# Patient Record
Sex: Male | Born: 1980 | Race: Black or African American | Hispanic: No | Smoking: Current every day smoker
Health system: Southern US, Community
[De-identification: ages and names within clinical notes are randomized; demographics above are authoritative.]

## PROBLEM LIST (undated history)

## (undated) DIAGNOSIS — Z789 Other specified health status: Secondary | ICD-10-CM

## (undated) DIAGNOSIS — I10 Essential (primary) hypertension: Secondary | ICD-10-CM

## (undated) DIAGNOSIS — W3400XA Accidental discharge from unspecified firearms or gun, initial encounter: Secondary | ICD-10-CM

## (undated) HISTORY — DX: Essential (primary) hypertension: I10

---

## 2014-10-25 ENCOUNTER — Emergency Department (HOSPITAL_COMMUNITY)
Admission: EM | Admit: 2014-10-25 | Discharge: 2014-10-26 | Disposition: A | Discharge status: Home / Self Care | Payer: Self-pay | Attending: Emergency Medicine | Admitting: Emergency Medicine

## 2014-10-25 ENCOUNTER — Encounter (HOSPITAL_COMMUNITY): Payer: Self-pay | Admitting: Emergency Medicine

## 2014-10-25 DIAGNOSIS — G8929 Other chronic pain: Secondary | ICD-10-CM | POA: Insufficient documentation

## 2014-10-25 DIAGNOSIS — M25572 Pain in left ankle and joints of left foot: Secondary | ICD-10-CM | POA: Insufficient documentation

## 2014-10-25 DIAGNOSIS — Z720 Tobacco use: Secondary | ICD-10-CM | POA: Insufficient documentation

## 2014-10-25 DIAGNOSIS — M549 Dorsalgia, unspecified: Secondary | ICD-10-CM

## 2014-10-25 DIAGNOSIS — M545 Low back pain: Secondary | ICD-10-CM | POA: Insufficient documentation

## 2014-10-25 NOTE — ED Notes (Signed)
Pt c/o lower back pain x's 4-5 days.  No known injury.  Pt also st's he fell approx 1 1/2 weeks ago and c/o pain and swelling to left ankle

## 2014-10-26 MED ORDER — KETOROLAC TROMETHAMINE 60 MG/2ML IM SOLN
60.0000 mg | Freq: Once | INTRAMUSCULAR | Status: AC
  Administered 2014-10-26: 60 mg via INTRAMUSCULAR
  Filled 2014-10-26: qty 2

## 2014-10-26 NOTE — Discharge Instructions (Signed)
Return to the emergency room with worsening of symptoms, new symptoms or with symptoms that are concerning , especially fevers, loss of control of bladder or bowels, numbness or tingling around genital region or anus, weakness. °RICE: Rest, Ice (three cycles of 20 mins on, 20mins off at least twice a day), compression/brace, elevation. Heating pad works well for back pain. °Ibuprofen 400mg (2 tablets 200mg) every 5-6 hours for 3-5 days. °Follow up with PCP/orthopedist if symptoms worsen or are persistent. °Read below information and follow recommendations. ° °Back Injury Prevention °Back injuries can be extremely painful and difficult to heal. After having one back injury, you are much more likely to experience another later on. It is important to learn how to avoid injuring or re-injuring your back. The following tips can help you to prevent a back injury. °PHYSICAL FITNESS °· Exercise regularly and try to develop good tone in your abdominal muscles. Your abdominal muscles provide a lot of the support needed by your back. °· Do aerobic exercises (walking, jogging, biking, swimming) regularly. °· Do exercises that increase balance and strength (tai chi, yoga) regularly. This can decrease your risk of falling and injuring your back. °· Stretch before and after exercising. °· Maintain a healthy weight. The more you weigh, the more stress is placed on your back. For every pound of weight, 10 times that amount of pressure is placed on the back. °DIET °· Talk to your caregiver about how much calcium and vitamin D you need per day. These nutrients help to prevent weakening of the bones (osteoporosis). Osteoporosis can cause broken (fractured) bones that lead to back pain. °· Include good sources of calcium in your diet, such as dairy products, green, leafy vegetables, and products with calcium added (fortified). °· Include good sources of vitamin D in your diet, such as milk and foods that are fortified with vitamin  D. °· Consider taking a nutritional supplement or a multivitamin if needed. °· Stop smoking if you smoke. °POSTURE °· Sit and stand up straight. Avoid leaning forward when you sit or hunching over when you stand. °· Choose chairs with good low back (lumbar) support. °· If you work at a desk, sit close to your work so you do not need to lean over. Keep your chin tucked in. Keep your neck drawn back and elbows bent at a right angle. Your arms should look like the letter "L." °· Sit high and close to the steering wheel when you drive. Add a lumbar support to your car seat if needed. °· Avoid sitting or standing in one position for too long. Take breaks to get up, stretch, and walk around at least once every hour. Take breaks if you are driving for long periods of time. °· Sleep on your side with your knees slightly bent, or sleep on your back with a pillow under your knees. Do not sleep on your stomach. °LIFTING, TWISTING, AND REACHING °· Avoid heavy lifting, especially repetitive lifting. If you must do heavy lifting: °¨ Stretch before lifting. °¨ Work slowly. °¨ Rest between lifts. °¨ Use carts and dollies to move objects when possible. °¨ Make several small trips instead of carrying 1 heavy load. °¨ Ask for help when you need it. °¨ Ask for help when moving big, awkward objects. °· Follow these steps when lifting: °¨ Stand with your feet shoulder-width apart. °¨ Get as close to the object as you can. Do not try to pick up heavy objects that are far from your body. °¨   Use handles or lifting straps if they are available. °¨ Bend at your knees. Squat down, but keep your heels off the floor. °¨ Keep your shoulders pulled back, your chin tucked in, and your back straight. °¨ Lift the object slowly, tightening the muscles in your legs, abdomen, and buttocks. Keep the object as close to the center of your body as possible. °¨ When you put a load down, use these same guidelines in reverse. °· Do not: °¨ Lift the object  above your waist. °¨ Twist at the waist while lifting or carrying a load. Move your feet if you need to turn, not your waist. °¨ Bend over without bending at your knees. °· Avoid reaching over your head, across a table, or for an object on a high surface. °OTHER TIPS °· Avoid wet floors and keep sidewalks clear of ice to prevent falls. °· Do not sleep on a mattress that is too soft or too hard. °· Keep items that are used frequently within easy reach. °· Put heavier objects on shelves at waist level and lighter objects on lower or higher shelves. °· Find ways to decrease your stress, such as exercise, massage, or relaxation techniques. Stress can build up in your muscles. Tense muscles are more vulnerable to injury. °· Seek treatment for depression or anxiety if needed. These conditions can increase your risk of developing back pain. °SEEK MEDICAL CARE IF: °· You injure your back. °· You have questions about diet, exercise, or other ways to prevent back injuries. °MAKE SURE YOU: °· Understand these instructions. °· Will watch your condition. °· Will get help right away if you are not doing well or get worse. °Document Released: 08/16/2004 Document Revised: 10/01/2011 Document Reviewed: 08/20/2011 °[employer]® Patient Information ©2015 [employer]. This information is not intended to replace advice given to you by your health care provider. Make sure you discuss any questions you have with your health care provider. ° ° °

## 2014-10-26 NOTE — ED Notes (Signed)
Pt stable, ambulatory, pain decreased to 5/10, wife at bedside

## 2014-10-26 NOTE — ED Provider Notes (Signed)
CSN: [id number]     Arrival date & time 10/25/14  2244 History   First MD Initiated Contact with Patient 10/26/14 0008     Chief Complaint  Patient presents with  . Back Pain     (Consider location/radiation/quality/duration/timing/severity/associated sxs/prior Treatment) HPI  Bill [name] is a 34 y.o. male with PMH of herniated disc on right presenting with complaint of right-sided low back pain. Past 3-4 days. 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 Ports history of herniated disc diagnosed 3 years ago with MRI and it feels the same. No new neurological symptoms. No known injury. Movement makes his pain worse. He has not taken anything for his symptoms. No fevers, chills, night sweats, weight loss, IVDU, history of malignancy. No loss of control of bladder or bowel. No numbness/tingling, weakness or saddle anesthesia.  Patient also with complaint of fall 1-1/2 weeks ago with pain to left ankle worse with movement. She has not taken anything for his symptoms.   History reviewed. No pertinent past medical history. History reviewed. No pertinent past surgical history. No family history on file. History  Substance Use Topics  . Smoking status: Current Every Day Smoker  . Smokeless tobacco: Not on file  . Alcohol Use: No    Review of Systems  Musculoskeletal: Positive for myalgias. Negative for joint swelling.  Skin: Negative for color change and wound.  Neurological: Negative for weakness and numbness.      Allergies  Review of patient's allergies indicates no known allergies.  Home Medications   Prior to Admission medications   Medication Sig Start Date End Date Taking? Authorizing Provider  Aspirin-Acetaminophen-Caffeine (MIGRAINE RELIEF PO) Take 1 tablet by mouth daily as needed (migraine).   Yes Historical Provider, MD  ibuprofen (ADVIL,MOTRIN) 200 MG tablet Take 200 mg by mouth every 6 (six) hours as needed for fever or mild pain.   Yes  Historical Provider, MD   BP 124/69 mmHg  Pulse 74  Temp(Src) 97.7 F (36.5 C)  Resp 18  Ht 5\' 11"  (1.803 m)  Wt 220 lb (99.791 kg)  BMI 30.70 kg/m2  SpO2 95% Physical Exam  Constitutional: He appears well-developed and well-nourished. No distress.  HENT:  Head: Normocephalic and atraumatic.  Eyes: Conjunctivae are normal. Right eye exhibits no discharge. Left eye exhibits no discharge.  Cardiovascular: Normal rate, regular rhythm and normal heart sounds.   Pulmonary/Chest: Effort normal and breath sounds normal. No respiratory distress. He has no wheezes.  Abdominal: Soft. Bowel sounds are normal. He exhibits no distension. There is no tenderness.  Musculoskeletal:  Left ankle: Tenderness to diffuse dorsal foot. No ecchymoses, warmth, redness, edema. Full range of motion without tenderness. No tenderness to right proximal fibular head. No tenderness over base of fifth metatarsal or navicular. No tenderness of lateral or medial malleolus.  No midline back tenderness, step off or crepitus. Right sided lower back tenderness.    Neurological: He is alert. Coordination normal.  Equal muscle tone. 5/5 strength in lower extremities. DTR equal and intact. Positive right straight leg test. Negative left. Normal gait.   Skin: Skin is warm and dry. He is not diaphoretic.  Psychiatric: He has a normal mood and affect. His behavior is normal.  Nursing note and vitals reviewed.   ED Course  Procedures (including critical care time) Labs Review Labs Reviewed - No data to display  Imaging Review No results found.   EKG Interpretation None      MDM   Final diagnoses:  Left ankle pain  Chronic back pain   Patient with complaint of acute on chronic back pain with normal neurological exam except for positive straight leg raise. Patient with element of sciatica. I doubt cauda equina. Pain managed in the ED. Discussed rice protocol and NSAID use. Patient also with complaint of remote  fall with left ankle tenderness no indication for x-ray due to [place] ankle rules. Conservative treatment discussed and patient given Ace bandage and the ED. Patient to follow-up with orthopedist/primary for persistent symptoms.   Discussed return precautions with patient. Discussed all results and patient verbalizes understanding and agrees with plan.   Oswaldo Conroy, PA-C 10/26/14 0106  Gilda Crease, MD 10/26/14 [phone]

## 2015-01-15 ENCOUNTER — Emergency Department (HOSPITAL_COMMUNITY)
Admission: EM | Admit: 2015-01-15 | Discharge: 2015-01-15 | Disposition: A | Discharge status: Home / Self Care | Payer: Self-pay | Attending: Emergency Medicine | Admitting: Emergency Medicine

## 2015-01-15 ENCOUNTER — Emergency Department (HOSPITAL_COMMUNITY): Payer: Self-pay

## 2015-01-15 ENCOUNTER — Encounter (HOSPITAL_COMMUNITY): Payer: Self-pay | Admitting: *Deleted

## 2015-01-15 DIAGNOSIS — Z7982 Long term (current) use of aspirin: Secondary | ICD-10-CM | POA: Insufficient documentation

## 2015-01-15 DIAGNOSIS — R0789 Other chest pain: Secondary | ICD-10-CM | POA: Insufficient documentation

## 2015-01-15 DIAGNOSIS — Z720 Tobacco use: Secondary | ICD-10-CM | POA: Insufficient documentation

## 2015-01-15 DIAGNOSIS — G43809 Other migraine, not intractable, without status migrainosus: Secondary | ICD-10-CM | POA: Insufficient documentation

## 2015-01-15 LAB — BASIC METABOLIC PANEL
Anion gap: 10 (ref 5–15)
BUN: 10 mg/dL (ref 6–20)
CO2: 27 mmol/L (ref 22–32)
CREATININE: 1.22 mg/dL (ref 0.61–1.24)
Calcium: 9.4 mg/dL (ref 8.9–10.3)
Chloride: 102 mmol/L (ref 101–111)
GFR calc non Af Amer: >60 mL/min (ref >60)
GLUCOSE: 135 mg/dL — AB (ref 65–99)
POTASSIUM: 3.9 mmol/L (ref 3.5–5.1)
Sodium: 139 mmol/L (ref 135–145)

## 2015-01-15 LAB — CBC
HEMATOCRIT: 44.8 % (ref 39.0–52.0)
Hemoglobin: 15.6 g/dL (ref 13.0–17.0)
MCH: 29.9 pg (ref 26.0–34.0)
MCHC: 34.8 g/dL (ref 30.0–36.0)
MCV: 86 fL (ref 78.0–100.0)
PLATELETS: 332 10*3/uL (ref 150–400)
RBC: 5.21 MIL/uL (ref 4.22–5.81)
RDW: 13 % (ref 11.5–15.5)
WBC: 11.7 10*3/uL — ABNORMAL HIGH (ref 4.0–10.5)

## 2015-01-15 LAB — I-STAT TROPONIN, ED: Troponin i, poc: 0 ng/mL (ref 0.00–0.08)

## 2015-01-15 MED ORDER — KETOROLAC TROMETHAMINE 30 MG/ML IJ SOLN
30.0000 mg | Freq: Once | INTRAMUSCULAR | Status: AC
  Administered 2015-01-15: 30 mg via INTRAVENOUS
  Filled 2015-01-15: qty 1

## 2015-01-15 MED ORDER — CYCLOBENZAPRINE HCL 10 MG PO TABS: 10.0000 mg | ORAL_TABLET | Freq: Two times a day (BID) | ORAL | Status: DC | PRN

## 2015-01-15 MED ORDER — DIPHENHYDRAMINE HCL 50 MG/ML IJ SOLN
25.0000 mg | Freq: Once | INTRAMUSCULAR | Status: AC
  Administered 2015-01-15: 25 mg via INTRAVENOUS
  Filled 2015-01-15: qty 1

## 2015-01-15 MED ORDER — METOCLOPRAMIDE HCL 5 MG/ML IJ SOLN
10.0000 mg | Freq: Once | INTRAMUSCULAR | Status: AC
  Administered 2015-01-15: 10 mg via INTRAVENOUS
  Filled 2015-01-15: qty 2

## 2015-01-15 MED ORDER — IBUPROFEN 800 MG PO TABS: 800.0000 mg | ORAL_TABLET | Freq: Three times a day (TID) | ORAL | Status: DC

## 2015-01-15 MED ORDER — SODIUM CHLORIDE 0.9 % IV BOLUS (SEPSIS)
1000.0000 mL | Freq: Once | INTRAVENOUS | Status: AC
  Administered 2015-01-15: 1000 mL via INTRAVENOUS

## 2015-01-15 NOTE — ED Notes (Signed)
Pt c/o CP to mid chest with no radiation and no other symptoms. Denies hx of the same.

## 2015-01-15 NOTE — ED Provider Notes (Signed)
CSN: [id number]     Arrival date & time 01/15/15  0155 History   First MD Initiated Contact with Patient 01/15/15 0449     Chief Complaint  Patient presents with  . Chest Pain     (Consider location/radiation/quality/duration/timing/severity/associated sxs/prior Treatment) HPI Comments: Patient is a 34 year old male with no past medical history who presents with chest pain that started 2 days ago. Symptoms started gradually and remained constant since the onset. The pain is sharp and located in his central chest without radiation. Palpation and movement makes the pain worse. No alleviating factors. Patient reports doing heavy lifting at work. He did not try anything for pain relief. He reports associated headache which feels like his typical migraine. No other associated symptoms.    History reviewed. No pertinent past medical history. History reviewed. No pertinent past surgical history. No family history on file. History  Substance Use Topics  . Smoking status: Current Every Day Smoker -- 0.50 packs/day    Types: Cigarettes  . Smokeless tobacco: Not on file  . Alcohol Use: No    Review of Systems  Cardiovascular: Positive for chest pain.  Neurological: Positive for headaches.  All other systems reviewed and are negative.     Allergies  Review of patient's allergies indicates no known allergies.  Home Medications   Prior to Admission medications   Medication Sig Start Date End Date Taking? Authorizing Provider  Aspirin-Acetaminophen-Caffeine (MIGRAINE RELIEF PO) Take 1 tablet by mouth daily as needed (migraine).    Historical Provider, MD  ibuprofen (ADVIL,MOTRIN) 200 MG tablet Take 200 mg by mouth every 6 (six) hours as needed for fever or mild pain.    Historical Provider, MD   BP 131/79 mmHg  Pulse 77  Temp(Src) 97.9 F (36.6 C) (Oral)  Resp 18  Ht  (1.803 m)  Wt 225 lb (102.059 kg)  BMI 31.39 kg/m2  SpO2 97% Physical Exam  Constitutional: He is oriented  to person, place, and time. He appears well-developed and well-nourished. No distress.  HENT:  Head: Normocephalic and atraumatic.  Eyes: Conjunctivae and EOM are normal.  Neck: Normal range of motion.  Cardiovascular: Normal rate and regular rhythm.  Exam reveals no gallop and no friction rub.   No murmur heard. No lower extremity edema or calf tenderness to palpation.   Pulmonary/Chest: Effort normal and breath sounds normal. He has no wheezes. He has no rales. He exhibits tenderness.  Central chest tenderness to palpation. No deformity.   Abdominal: Soft. He exhibits no distension. There is no tenderness. There is no rebound.  Musculoskeletal: Normal range of motion.  Neurological: He is alert and oriented to person, place, and time. Coordination normal.  Speech is goal-oriented. Moves limbs without ataxia.   Skin: Skin is warm and dry.  Psychiatric: He has a normal mood and affect. His behavior is normal.  Nursing note and vitals reviewed.   ED Course  Procedures (including critical care time) Labs Review Labs Reviewed  BASIC METABOLIC PANEL - Abnormal; Notable for the following:    Glucose, Bld 135 (*)    All other components within normal limits  CBC - Abnormal; Notable for the following:    WBC 11.7 (*)    All other components within normal limits  I-STAT TROPOININ, ED    Imaging Review Dg Chest 2 View  01/15/2015   CLINICAL DATA:  Chest pain  EXAM: CHEST  2 VIEW  COMPARISON:  None.  FINDINGS: Lungs are clear.  No  pleural effusion or pneumothorax.  The heart is normal in size.  Visualized osseous structures are within normal limits.  IMPRESSION: No evidence of acute cardiopulmonary disease.   Electronically Signed   By: Charline Bills M.D.   On: 01/15/2015 02:30     EKG Interpretation None      MDM   Final diagnoses:  Chest wall pain  Other migraine without status migrainosus, not intractable    5:23 AM Patient's labs and chest xray show no acute changes.  Patient chest pain is reproducible with palpation of central chest. Patient is PERC negative and low risk for major cardiac event. Pain has been constant for 2 days so negative troponin sufficient for rule out. Vitals stable and patient afebrile. Patient will have migraine cocktail here and be discharged with ibuprofen and flexeril.    [address] [place], PA-C 01/15/15 0277  Azalia Bilis, MD 01/15/15 4128  Azalia Bilis, MD 01/15/15 [phone]

## 2015-01-15 NOTE — Discharge Instructions (Signed)
Take ibuprofen as needed for pain. Take flexeril as needed for muscle spasm. You may take these together. Refer to attached documents for more information.

## 2015-05-28 ENCOUNTER — Encounter (HOSPITAL_COMMUNITY): Payer: Self-pay | Admitting: *Deleted

## 2015-05-28 ENCOUNTER — Emergency Department (HOSPITAL_COMMUNITY)
Admission: EM | Admit: 2015-05-28 | Discharge: 2015-05-28 | Disposition: A | Discharge status: Home / Self Care | Payer: Self-pay | Attending: Emergency Medicine | Admitting: Emergency Medicine

## 2015-05-28 DIAGNOSIS — Z720 Tobacco use: Secondary | ICD-10-CM | POA: Insufficient documentation

## 2015-05-28 DIAGNOSIS — Z791 Long term (current) use of non-steroidal anti-inflammatories (NSAID): Secondary | ICD-10-CM | POA: Insufficient documentation

## 2015-05-28 DIAGNOSIS — B353 Tinea pedis: Secondary | ICD-10-CM

## 2015-05-28 MED ORDER — IBUPROFEN 600 MG PO TABS: 600.0000 mg | ORAL_TABLET | Freq: Four times a day (QID) | ORAL | Status: DC | PRN

## 2015-05-28 MED ORDER — TRIAMCINOLONE ACETONIDE 0.1 % EX CREA: 1.0000 "application " | TOPICAL_CREAM | Freq: Two times a day (BID) | CUTANEOUS | Status: DC

## 2015-05-28 MED ORDER — TRAMADOL HCL 50 MG PO TABS
50.0000 mg | ORAL_TABLET | Freq: Once | ORAL | Status: AC
  Administered 2015-05-28: 50 mg via ORAL
  Filled 2015-05-28: qty 1

## 2015-05-28 NOTE — ED Provider Notes (Signed)
CSN: [id number]     Arrival date & time 05/28/15  1810 History  By signing my name below, I, Bill [name], attest that this documentation has been prepared under the direction and in the presence of Bill [name]. Electronically Signed: Elon [name] ED Scribe. 05/28/2015. 7:07 PM.  Chief Complaint  Patient presents with  . Foot Pain   The history is provided by the patient. No language interpreter was used.   HPI Comments: Bill [name] is a 34 y.o. male who presents to the Emergency Department complaining of constant, moderate, worsening left ball-of-foot pain that began yesterday. He adds that pain is worse with pressure and that he walks frequently. Patient denies using unknown bathing facilities or having frequently wet/damp feet. Patient denies pain in any other areas, prior injury, or prior episodes of symptoms prior to onset. NKDA.  He denies leg swelling. Pt is unkempt and him and his wife appear to not shower on a regular basis.  History reviewed. No pertinent past medical history. History reviewed. No pertinent past surgical history. History reviewed. No pertinent family history. Social History  Substance Use Topics  . Smoking status: Current Every Day Smoker -- 0.50 packs/day    Types: Cigarettes  . Smokeless tobacco: None  . Alcohol Use: No    Review of Systems A complete 10 system review of systems was obtained and all systems are negative except as noted in the HPI and PMH.   Allergies  Fish allergy and Other  Home Medications   Prior to Admission medications   Medication Sig Start Date End Date Taking? Authorizing Provider  cyclobenzaprine (FLEXERIL) 10 MG tablet Take 1 tablet (10 mg total) by mouth 2 (two) times daily as needed for muscle spasms. 01/15/15   Kaitlyn Szekalski, PA-C  ibuprofen (ADVIL,MOTRIN) 600 MG tablet Take 1 tablet (600 mg total) by mouth every 6 (six) hours as needed for moderate pain. 05/28/15   Lilac Hoff Neva Seat, PA-C  ibuprofen (ADVIL,MOTRIN) 800  MG tablet Take 1 tablet (800 mg total) by mouth 3 (three) times daily. 01/15/15   Emilia Beck, PA-C  triamcinolone cream (KENALOG) 0.1 % Apply 1 application topically 2 (two) times daily. $4 list 80 gm tube of cream 05/28/15   Hajime Asfaw, PA-C   BP 123/62 mmHg  Pulse 77  Temp(Src) 98.4 F (36.9 C)  Resp 16  Ht  (1.803 m)  Wt 225 lb (102.059 kg)  BMI 31.39 kg/m2  SpO2 99% Physical Exam  Constitutional: He is oriented to person, place, and time. He appears well-developed and well-nourished. No distress.  HENT:  Head: Normocephalic and atraumatic.  Eyes: Conjunctivae and EOM are normal.  Neck: Neck supple. No tracheal deviation present.  Cardiovascular: Normal rate.   Pulmonary/Chest: Effort normal. No respiratory distress.  Musculoskeletal: Normal range of motion.  Left foot exhibits erythematous erosions and scales between the toes. NO associated fissures or ulcers. No drainage or fluctuance, no vesicles or bullous. Rash spares rest of body  Neurological: He is alert and oriented to person, place, and time.  Skin: Skin is warm and dry.  Psychiatric: He has a normal mood and affect. His behavior is normal.  Nursing note and vitals reviewed.   ED Course  Procedures  DIAGNOSTIC STUDIES: Oxygen Saturation is 99% on RA, normal by my interpretation.    COORDINATION OF CARE: 6:56 PM Will prescribe antifungal cream and ibuprofen for pain. Advised to keep area dry and clean. Patient offered but declined crutches. Patient acknowledges and agrees with plan.  Referred to derm, advised to come back for alternative treatment if rash fails to get worse or worsens at any time.  Labs Review Labs Reviewed - No data to display  Imaging Review No results found. I have personally reviewed and evaluated these images and lab results as part of my medical decision-making.   EKG Interpretation None      MDM   Final diagnoses:  Athlete's foot on left    Medications  traMADol  (ULTRAM) tablet 50 mg (50 mg Oral Given 05/28/15 1914)    34 y.o.Mete Dudenhoeffer's medical screening exam was performed and I feel the patient has had an appropriate workup for their chief complaint at this time and likelihood of emergent condition existing is low. They have been counseled on decision, discharge, follow up and which symptoms necessitate immediate return to the emergency department. They or their family verbally stated understanding and agreement with plan and discharged in stable condition.   Vital signs are stable at discharge. Filed Vitals:   05/28/15 1826  BP: 123/62  Pulse: 77  Temp: 98.4 F (36.9 C)  Resp: 16    I personally performed the services described in this documentation, which was scribed in my presence. The recorded information has been reviewed and is accurate.    Bill Peliffany Jaxxson [place], PA-C 05/28/15 1929  Cathren LaineKevin Steinl, MD 05/28/15 [phone]

## 2015-05-28 NOTE — ED Notes (Signed)
PT reports pain to LT foot started on Friday night. Pt has a red area of skin located on ball of Lt foot extending up between toes.

## 2015-05-28 NOTE — Discharge Instructions (Signed)
Athlete's Foot  Athlete's foot is a skin infection caused by a fungus. Athlete's foot is often seen between or under the toes. It can also be seen on the bottom of the foot. Athlete's foot can spread to other people by sharing towels or shower stalls. HOME CARE  Only take medicines as told by your doctor. Do not use steroid creams.  Wash your feet daily. Dry your feet well, especially between the toes.  Change your socks every day. Wear cotton or wool socks.  Change your socks 2 to 3 times a day in hot weather.  Wear sandals or canvas tennis shoes with good airflow.  If you have blisters, soak your feet in a solution as told by your doctor. Do this for 20 to 30 minutes, 2 times a day. Dry your feet well after you soak them.  Do not share towels.  Wear sandals when you use shared locker rooms or showers. GET HELP RIGHT AWAY IF:   You have a fever.  Your foot is puffy (swollen), sore, warm, or red.  You are not getting better after 7 days of treatment.  You still have athlete's foot after 30 days.  You have problems caused by your medicine. MAKE SURE YOU:   Understand these instructions.  Will watch your condition.  Will get help right away if you are not doing well or get worse.   This information is not intended to replace advice given to you by your health care provider. Make sure you discuss any questions you have with your health care provider.   Document Released: 12/26/2007 Document Revised: 10/01/2011 Document Reviewed: 01/10/2015 Elsevier Interactive Patient Education 2016 [place] Released: 07/06/2000 Document Revised: 10/01/2011 Document Reviewed: 01/10/2015 Elsevier Interactive Patient Education 2016 [place] GUIDE  Chronic Pain Problems: Contact [facility]  [phone] Patients need to be referred by their primary care doctor.  Insufficient Money for Medicine: Contact United Way:  call "211" or  [place] [phone].  No Primary Care Doctor: Call Health Connect  [phone] - can help you locate a primary care doctor that  accepts your insurance, provides certain services, etc. Physician Referral Service- [phone]  Agencies that provide inexpensive medical care: [facility]  [facility]  [phone] [facility]  [phone] [facility]  [phone] Planned Parenthood  [phone] [facility]  [phone]  [place] Providers: [facility]- [address], [address]  [phone], Mon-Fri 9am-7pm, Sat 9am-1pm [place], [address] [place]  [place], [address] [place]  [place]- [address]  [place], [address], [phone]  Only accepts [place] patients after they have their name  applied to their card  Self Pay (no insurance) in [facility]: Sickle Cell Patients: Dr Kevan Ny, [facility]  [place], [facility]- [place]  [place] [place]- [address], [facility]- see information above (Speak to [employer] if you do not have insurance)       -  [facility]- [place],  [place],  [place]       -  [facility]- [address], [place]  Dr Vista Lawman-  [address], [place], [place] Urgent Care- [address], [phone]       -  [facility] [place]- [address], [place], also [address], [phone]       -    [facility]- [address], [place], 1st & 3rd Saturday   every month, 10am-1pm  1) Find a  Doctor and Pay Out of Pocket Although you won't have to find out who is covered by your insurance plan, it is a good idea to ask around and get recommendations. You will then need to call the office and see if the doctor you have chosen will accept you as a new patient and what types of options they offer for patients who are self-pay. Some doctors offer discounts or will set up payment plans for their patients who do not have insurance, but you will need to ask so you aren't surprised when you get to your appointment.  2) Contact Your Local Health Department Not all health departments have doctors that can see patients for sick visits, but many do, so it is worth a call to see if yours does. If you don't know where your local health department is, you can check in your phone book. The CDC also has a tool to help you locate your state's health department, and many state websites also have listings of all of their local health departments.  3) Find a [facility] If your illness is not likely to be very severe or complicated, you may want to try a walk in clinic. These are popping up all over the country in pharmacies, drugstores, and shopping centers. They're usually staffed by nurse practitioners or physician assistants that have been trained to treat common illnesses and complaints. They're usually fairly quick and inexpensive. However, if you have serious medical issues or chronic medical problems, these are probably not your best option  [facility] [place], [facility], [address] [place], phone [phone] or [phone].  Monday - Friday, call for an appointment. [place], [facility], [address], [place], phone [phone] or [phone].  Monday - Friday, call for an appointment.  Abuse/Neglect: [place] [phone] [place]  [phone] (After Hours)  Emergency Shelter:  [facility] [phone]  Maternity Homes: [facility] [place] [phone] [place] [phone]  MRSA Hotline #:   (901)503-6641  [facility]. [address] [place]  [facility] Phone:  [phone]                                  Phone:  [phone]                   Phone:  [phone]  [facility], [place]- [phone]       -     [facility] in [place], [address]                                  [phone], [place] [phone] or [phone] (After Hours)   [place]  Substance Abuse Resources: Alcohol and Drug Services  7177520727 [place] [phone] The [place] [phone] Chinita Pester [phone] Residential & Outpatient Substance Abuse Program  9057489548  Psychological Services: [place]  [phone] [place]  [place], [place] [address]. [address], [place], [place]: [phone] or [phone], [email or web address]  Dental Assistance  If unable to pay or uninsured, contact:  [facility] or [facility]. to become qualified for the adult dental clinic.  Patients with Medicaid: [facility] [phone] [address], [place] [address] [phone]  If unable to pay, or uninsured, contact [facility] [phone]) or [place] [phone] in [place], [place] in [facility]) to become  qualified for the adult dental clinic  Other [place]- [place], [place], [place], [address], [place], [place], 2nd and 4th Thursday of the month at 6:30am.  10 clients each day by appointment, can sometimes see walk-in patients if someone does not show for an appointment. [facility]- [address] Hillard Danker [place], [place], [address], [place], [place], [place], [address], [place] [facility] [phone]  Please make every effort to establish with a primary care physician for routine medical care  [place]  The [place] provides a wide range of adult health services. Some of these services are designed to address the healthcare needs of all [facility] residents and all services are designed to meet the needs of uninsured/underinsured low income residents. Some services are available to any resident of [place], call [phone] for details. ] The [facility], a new medical clinic for adults, is now open. For more information about the Center and its services please call [phone]. For information on our [place] services, click here.  For more information on any of the following Department of Public Health programs, including hours of service, click on the highlighted link.  SERVICES FOR WOMEN (Adults and Teens) [employer] provide a full range of birth control options plus education and counseling. New patient visit and annual return visits include a complete examination, pap test as indicated, and other laboratory as indicated. Included is our [employer] for men.  Maternity Care is provided through pregnancy, including a six week post partum exam. Women who meet  eligibility criteria for the Medicaid for Pregnant Women program, receive care free. Other women are charged on a sliding scale according to income. Note: Our  Dental Clinic provides services to pregnant women who have a Medicaid card. Call [phone] for an appointment in [place] or [phone] for an appointment in [facility].  Primary Care for Medicaid [place] Access Women is available through the [place]. As primary care provider for the [place] program, women may designate the [facility] clinic as their primary care provider.  PLEASE CALL [phone] FOR AN APPOINTMENT FOR THE ABOVE SERVICES IN EITHER [place] OR [place]. Information available in [place] and [place].   Childbirth Education Classes are open to the public and offered to help families prepare for the best possible childbirth experience as well as to promote lifelong health and wellness. Classes are offered throughout the year and meet on the same night once a week for five weeks. Medicaid covers the cost of the classes for the mother-to-be and her partner. For participants without Medicaid, the cost of the class series is $45.00 for the mother-to-be and her partner. Class size is limited and registration is required. For more information or to register call [phone]. Baby items donated by [employer] and the [employer] [place] are given away during each class series.  SERVICES FOR WOMEN AND MEN Sexually Transmitted Infection appointments, including HIV testing, are available daily (weekdays, except holidays). Call early as same-day appointments are limited. For an appointment in either [facility] or [place], call [phone]. Services are confidential and free of charge.  Skin Testing for Tuberculosis Please call [phone]. Adult Immunizations are available, usually for a fee. Please call [phone] for details.  PLEASE CALL  [phone] FOR AN APPOINTMENT FOR THE ABOVE SERVICES IN EITHER [place] OR [place].   [facility] provides up to the minute recommended vaccines for your travel destination. We also provide essential health and political information to help insure a safe and pleasurable travel experience. This program is self-sustaining, however, fees are very competitive. We are a CERTIFIED YELLOW FEVER IMMUNIZATION approved clinic site. PLEASE CALL [phone] FOR AN APPOINTMENT IN EITHER [place] OR [place].   If you have questions about the services listed above, we want to answer them! Email [place] at: [email or web address] Visiting Services for elderly and the disabled are available to residents of [facility] who are in need of care that compares to the care offered by a nursing home, have needs that can be met by the program, and have [place] Medicaid. Other short term services are available to residents 18 years and older who are unable to meet requirements for eligibility to receive services from a certified home health agency, spend the majority of time at home, and need care for six months or less.  PLEASE CALL [phone] OR [phone] FOR MORE INFORMATION. Medication Assistance Program serves as a link between pharmaceutical companies and patients to provide low cost or free prescription medications. This servce is available for residents who meet certain income restrictions and have no insurance coverage.  PLEASE CALL [phone] ([place]) OR [phone] ([place]) FOR MORE INFORMATION.  Updated Feb. 21, 2013

## 2015-06-30 ENCOUNTER — Emergency Department (HOSPITAL_COMMUNITY)
Admission: EM | Admit: 2015-06-30 | Discharge: 2015-06-30 | Disposition: A | Discharge status: Home / Self Care | Payer: Self-pay | Attending: Emergency Medicine | Admitting: Emergency Medicine

## 2015-06-30 ENCOUNTER — Emergency Department (HOSPITAL_COMMUNITY): Payer: Self-pay

## 2015-06-30 ENCOUNTER — Encounter (HOSPITAL_COMMUNITY): Payer: Self-pay | Admitting: Emergency Medicine

## 2015-06-30 DIAGNOSIS — Y9367 Activity, basketball: Secondary | ICD-10-CM | POA: Insufficient documentation

## 2015-06-30 DIAGNOSIS — S63259A Unspecified dislocation of unspecified finger, initial encounter: Secondary | ICD-10-CM

## 2015-06-30 DIAGNOSIS — Z791 Long term (current) use of non-steroidal anti-inflammatories (NSAID): Secondary | ICD-10-CM | POA: Insufficient documentation

## 2015-06-30 DIAGNOSIS — X58XXXA Exposure to other specified factors, initial encounter: Secondary | ICD-10-CM | POA: Insufficient documentation

## 2015-06-30 DIAGNOSIS — Y998 Other external cause status: Secondary | ICD-10-CM | POA: Insufficient documentation

## 2015-06-30 DIAGNOSIS — F17210 Nicotine dependence, cigarettes, uncomplicated: Secondary | ICD-10-CM | POA: Insufficient documentation

## 2015-06-30 DIAGNOSIS — S63256A Unspecified dislocation of right little finger, initial encounter: Secondary | ICD-10-CM | POA: Insufficient documentation

## 2015-06-30 DIAGNOSIS — Z7952 Long term (current) use of systemic steroids: Secondary | ICD-10-CM | POA: Insufficient documentation

## 2015-06-30 DIAGNOSIS — Y92310 Basketball court as the place of occurrence of the external cause: Secondary | ICD-10-CM | POA: Insufficient documentation

## 2015-06-30 MED ORDER — OXYCODONE-ACETAMINOPHEN 5-325 MG PO TABS
2.0000 | ORAL_TABLET | Freq: Once | ORAL | Status: AC
  Administered 2015-06-30: 2 via ORAL
  Filled 2015-06-30: qty 2

## 2015-06-30 MED ORDER — LIDOCAINE HCL (PF) 1 % IJ SOLN
5.0000 mL | Freq: Once | INTRAMUSCULAR | Status: AC
  Administered 2015-06-30: 5 mL via INTRADERMAL
  Filled 2015-06-30: qty 5

## 2015-06-30 NOTE — ED Notes (Signed)
Pt from home with c/o right 5th digit injury with deformity and swelling noted s/p playing basketball yesterday.  NAD, A&O.

## 2015-06-30 NOTE — Discharge Instructions (Signed)
Finger Dislocation Take ibuprofen or Tylenol for pain.  A finger dislocation happens when your finger bones separate from where they connect with each other. It usually happens to the joint closest to your knuckle (proximal interphalangeal joint). Your doctor will put your bones back in the joint. This may be done by pulling on your finger or through surgery. A bandage (dressing) or splint will be placed around your joint. The bandage or splint holds your finger in place while it heals. HOME CARE   Rest your injured joint. Do not move it until told to do so.  Put ice on your injured joint as told by your doctor.  Put ice in a plastic bag.  Place a towel between your skin and the bag.  Leave the ice on for 15-20 minutes at a time. Do this every 2 hours while you are awake.  Raise (elevate) your hand above your heart as told by your doctor.  Only take medicines as told by your doctor. GET HELP RIGHT AWAY IF:  Your bandage or splint becomes damaged.  Your pain becomes worse, not better.  You lose feeling in your finger or it becomes cold or white. MAKE SURE YOU:  Understand these instructions.  Will watch your condition.  Will get help right away if you are not doing well or get worse.   This information is not intended to replace advice given to you by your health care provider. Make sure you discuss any questions you have with your health care provider.   Document Released: 06/28/2011 Document Revised: 07/30/2014 Document Reviewed: 12/03/2014 Elsevier Interactive Patient Education [employer]2016 Elsevier Inc.

## 2015-06-30 NOTE — ED Provider Notes (Signed)
History  By signing my name below, I, Karle Plumber, attest that this documentation has been prepared under the direction and in the presence of [employer], PA-C. Electronically Signed: Karle Plumber, ED Scribe. 06/30/2015. 2:58 PM.  Chief Complaint  Patient presents with  . Finger Injury   The history is provided by the patient and medical records. No language interpreter was used.    HPI Comments:  Bill [name] is a 34 y.o. male who presents to the Emergency Department complaining of severe pain of the right fifth digit from an injury he sustained while playing basketball yesterday. He reports associated swelling of the finger. He has not taken anything for pain. Touching or trying to move the right 5th digit increases the pain. He denies modifying factors. He denies numbness, tingling or weakness of the right 5th digit or right hand, bruising or wounds.   History reviewed. No pertinent past medical history. History reviewed. No pertinent past surgical history. History reviewed. No pertinent family history. Social History  Substance Use Topics  . Smoking status: Current Every Day Smoker -- 0.50 packs/day    Types: Cigarettes  . Smokeless tobacco: Never Used  . Alcohol Use: No    Review of Systems  Musculoskeletal: Positive for joint swelling and arthralgias.  Skin: Negative for color change and wound.  Neurological: Negative for weakness and numbness.    Allergies  Fish allergy and Other  Home Medications   Prior to Admission medications   Medication Sig Start Date End Date Taking? Authorizing Provider  cyclobenzaprine (FLEXERIL) 10 MG tablet Take 1 tablet (10 mg total) by mouth 2 (two) times daily as needed for muscle spasms. 01/15/15   Kaitlyn Szekalski, PA-C  ibuprofen (ADVIL,MOTRIN) 600 MG tablet Take 1 tablet (600 mg total) by mouth every 6 (six) hours as needed for moderate pain. 05/28/15   Tiffany Neva Seat, PA-C  ibuprofen (ADVIL,MOTRIN) 800 MG tablet Take  1 tablet (800 mg total) by mouth 3 (three) times daily. 01/15/15   Emilia Beck, PA-C  triamcinolone cream (KENALOG) 0.1 % Apply 1 application topically 2 (two) times daily. $4 list 80 gm tube of cream 05/28/15   Marlon Pel, PA-C   Triage Vitals: BP 117/84 mmHg  Pulse 65  Temp(Src) 98.2 F (36.8 C) (Oral)  Resp 18  SpO2 98% Physical Exam  Constitutional: He is oriented to person, place, and time. He appears well-developed and well-nourished.  HENT:  Head: Normocephalic and atraumatic.  Eyes: EOM are normal.  Neck: Normal range of motion.  Cardiovascular: Normal rate.   Cap refill less than 3 seconds. Right radial pulse 2+.  Pulmonary/Chest: Effort normal.  Musculoskeletal: Normal range of motion.  Able to flex and extend all fingers on right hand except 5th digit. Significant swelling to fifth digit of right hand.  Neurological: He is alert and oriented to person, place, and time.  Skin: Skin is warm and dry.  Psychiatric: He has a normal mood and affect. His behavior is normal.  Nursing note and vitals reviewed.   ED Course  Procedures (including critical care time) DIAGNOSTIC STUDIES: Oxygen Saturation is 98% on RA, normal by my interpretation.   COORDINATION OF CARE: 1:58 PM- Informed pt of right 5th digit dislocation. Will order pain medication and administer digital block prior to reducing finger. Pt verbalizes understanding and agrees to plan.  2:15- Administered digital block to right fifth digit.  2:59 PM- Reduced 5th digit. Applied splint.  Medications  oxyCODONE-acetaminophen (PERCOCET/ROXICET) 5-325 MG per tablet 2 tablet (2  tablets Oral Given 06/30/15 1403)  lidocaine (PF) (XYLOCAINE) 1 % injection 5 mL (5 mLs Intradermal Given by Other 06/30/15 1404)   NERVE BLOCK Performed by: Catha GosselinPatel-Mills, Mitzie Marlar Consent: Verbal consent obtained. Required items: required blood products, implants, devices, and special equipment available Time out: Immediately prior to  procedure a "time out" was called to verify the correct patient, procedure, equipment, support staff and site/side marked as required. Indication: comfort Nerve block body site: right fifth digit Preparation: Patient was prepped and draped in the usual sterile fashion. Needle gauge: 25 G Location technique: anatomical landmarks Local anesthetic: lidocaine 1% without epinepherine Anesthetic total: 5 ml Outcome: pain improved Patient tolerance: Patient tolerated the procedure well with no immediate complications. I did not think that re-x-raying the patient was necessary since I could visualize that the finger was back in place.  Labs Review Labs Reviewed - No data to display  Imaging Review Dg Hand Complete Right  06/30/2015  CLINICAL DATA:  Basketball injury yesterday EXAM: RIGHT HAND - COMPLETE 3+ VIEW COMPARISON:  none FINDINGS: .Dorsal dislocation of the fifth PIP joint. No definite fracture. Otherwise negative. IMPRESSION: Dislocated fifth PIP joint . Electronically Signed   By: Marlan Palauharles  Clark M.D.   On: 06/30/2015 13:37   I have personally reviewed and evaluated these images results as part of my medical decision-making.   EKG Interpretation None      MDM   Final diagnoses:  Finger dislocation, initial encounter   Patient X-Ray negative for obvious fracture but shows dislocation of fifth PIP joint. Finger was relocated. Pt advised to follow up with orthopedics. Splint applied to right fifth digit while in ED, conservative therapy recommended and discussed. Patient will be discharged home & is agreeable with above plan. Returns precautions discussed. Pt appears safe for discharge.  I personally performed the services described in this documentation, which was scribed in my presence. The recorded information has been reviewed and is accurate.     Catha GosselinHanna Patel-Mills, PA-C 06/30/15 1914  Pricilla LovelessScott Goldston, MD 07/03/15 [phone]

## 2015-07-14 DIAGNOSIS — Z5900 Homelessness unspecified: Secondary | ICD-10-CM

## 2015-07-14 NOTE — Congregational Nurse Program (Signed)
Congregational Nurse Program Note  Date of Encounter: 07/14/2015  Past Medical History: No past medical history on file.  Encounter Details:     CNP Questionnaire - 07/14/15 0855    Patient Demographics   Is this a new or existing patient? New   Patient is considered a/an Not Applicable   Patient Assistance   Patient's financial/insurance status Uninsured;Low Income   Patient referred to apply for the following financial assistance [employer] Card/Care Connects   Food insecurities addressed Provided food supplies   Transportation assistance No   Assistance securing medications Yes   Educational health offerings Acute disease;Navigating the healthcare system;Medications  smoking cessation   Encounter Details   Primary purpose of visit Acute Illness/Condition Visit;Education/Health Concerns;Navigating the Healthcare System   Was an Emergency Department visit averted? Yes   Does patient have a medical provider? No   Patient referred to Clinic   Was a mental health screening completed? (GAINS tool) No   Does patient have dental issues? No   Since previous encounter, have you referred patient for abnormal blood pressure that resulted in a new diagnosis or medication change? No   Since previous encounter, have you referred patient for abnormal blood glucose that resulted in a new diagnosis or medication change? No   For Abstraction Use Only   Does patient have insurance? No       Clinic visit.  Was seen in the ED on 06/30/15 for a dislocated finger, right hand.  Is not wearing splint, states "it fell off".  Complains of pain and swelling.  States, does not know what to do.  Also requested B/P check.  Referral made to Lavinia SharpsMary Ann Placey, NP at [facility] for assistance with orange card and f/u with dislocated finger.  [facility]  (Zoloft and Sereque).

## 2015-07-14 NOTE — Patient Instructions (Signed)
1.  [facility], see Lavinia SharpsMary Ann Placey NP for follow up with dislocated finger 2.  [facility] to apply for assistance with psychiatric medications 3.  Continue Ibuprofen as directed by ED physician 4.  Return [facility] nurse clinic if unable to see Chales AbrahamsMary Ann today to have splint re-applied.

## 2016-04-10 ENCOUNTER — Inpatient Hospital Stay (HOSPITAL_COMMUNITY)
Admission: EM | Admit: 2016-04-10 | Discharge: 2016-05-08 | DRG: 957 | Disposition: A | Discharge status: Rehabilitation Facility | Payer: Self-pay | Attending: General Surgery | Admitting: General Surgery

## 2016-04-10 ENCOUNTER — Emergency Department (HOSPITAL_COMMUNITY): Payer: Self-pay | Admitting: Certified Registered Nurse Anesthetist

## 2016-04-10 ENCOUNTER — Encounter (HOSPITAL_COMMUNITY): Payer: Self-pay

## 2016-04-10 ENCOUNTER — Emergency Department (HOSPITAL_COMMUNITY): Payer: Self-pay

## 2016-04-10 ENCOUNTER — Encounter (HOSPITAL_COMMUNITY): Admission: EM | Disposition: A | Discharge status: Rehabilitation Facility | Payer: Self-pay | Source: Home / Self Care

## 2016-04-10 DIAGNOSIS — J9600 Acute respiratory failure, unspecified whether with hypoxia or hypercapnia: Secondary | ICD-10-CM

## 2016-04-10 DIAGNOSIS — Z4659 Encounter for fitting and adjustment of other gastrointestinal appliance and device: Secondary | ICD-10-CM

## 2016-04-10 DIAGNOSIS — J939 Pneumothorax, unspecified: Secondary | ICD-10-CM

## 2016-04-10 DIAGNOSIS — I471 Supraventricular tachycardia: Secondary | ICD-10-CM | POA: Diagnosis present

## 2016-04-10 DIAGNOSIS — Z9289 Personal history of other medical treatment: Secondary | ICD-10-CM

## 2016-04-10 DIAGNOSIS — R4189 Other symptoms and signs involving cognitive functions and awareness: Secondary | ICD-10-CM

## 2016-04-10 DIAGNOSIS — S36893A Laceration of other intra-abdominal organs, initial encounter: Secondary | ICD-10-CM | POA: Diagnosis present

## 2016-04-10 DIAGNOSIS — D62 Acute posthemorrhagic anemia: Secondary | ICD-10-CM | POA: Diagnosis present

## 2016-04-10 DIAGNOSIS — Z419 Encounter for procedure for purposes other than remedying health state, unspecified: Secondary | ICD-10-CM

## 2016-04-10 DIAGNOSIS — G9349 Other encephalopathy: Secondary | ICD-10-CM | POA: Diagnosis not present

## 2016-04-10 DIAGNOSIS — S27321A Contusion of lung, unilateral, initial encounter: Secondary | ICD-10-CM | POA: Diagnosis present

## 2016-04-10 DIAGNOSIS — Z9109 Other allergy status, other than to drugs and biological substances: Secondary | ICD-10-CM

## 2016-04-10 DIAGNOSIS — S31109A Unspecified open wound of abdominal wall, unspecified quadrant without penetration into peritoneal cavity, initial encounter: Secondary | ICD-10-CM

## 2016-04-10 DIAGNOSIS — D696 Thrombocytopenia, unspecified: Secondary | ICD-10-CM | POA: Diagnosis present

## 2016-04-10 DIAGNOSIS — Z91013 Allergy to seafood: Secondary | ICD-10-CM

## 2016-04-10 DIAGNOSIS — Z79899 Other long term (current) drug therapy: Secondary | ICD-10-CM

## 2016-04-10 DIAGNOSIS — J9382 Other air leak: Secondary | ICD-10-CM | POA: Diagnosis present

## 2016-04-10 DIAGNOSIS — J90 Pleural effusion, not elsewhere classified: Secondary | ICD-10-CM

## 2016-04-10 DIAGNOSIS — Z4682 Encounter for fitting and adjustment of non-vascular catheter: Secondary | ICD-10-CM

## 2016-04-10 DIAGNOSIS — S270XXA Traumatic pneumothorax, initial encounter: Secondary | ICD-10-CM

## 2016-04-10 DIAGNOSIS — Z6833 Body mass index (BMI) 33.0-33.9, adult: Secondary | ICD-10-CM

## 2016-04-10 DIAGNOSIS — J189 Pneumonia, unspecified organism: Secondary | ICD-10-CM | POA: Diagnosis present

## 2016-04-10 DIAGNOSIS — I480 Paroxysmal atrial fibrillation: Secondary | ICD-10-CM

## 2016-04-10 DIAGNOSIS — E669 Obesity, unspecified: Secondary | ICD-10-CM | POA: Diagnosis present

## 2016-04-10 DIAGNOSIS — I312 Hemopericardium, not elsewhere classified: Secondary | ICD-10-CM | POA: Diagnosis present

## 2016-04-10 DIAGNOSIS — R578 Other shock: Secondary | ICD-10-CM | POA: Diagnosis present

## 2016-04-10 DIAGNOSIS — T81509D Unspecified complication of foreign body accidentally left in body following unspecified procedure, subsequent encounter: Secondary | ICD-10-CM

## 2016-04-10 DIAGNOSIS — E876 Hypokalemia: Secondary | ICD-10-CM | POA: Diagnosis not present

## 2016-04-10 DIAGNOSIS — Z9689 Presence of other specified functional implants: Secondary | ICD-10-CM

## 2016-04-10 DIAGNOSIS — I493 Ventricular premature depolarization: Secondary | ICD-10-CM | POA: Diagnosis not present

## 2016-04-10 DIAGNOSIS — R531 Weakness: Secondary | ICD-10-CM

## 2016-04-10 DIAGNOSIS — W3400XA Accidental discharge from unspecified firearms or gun, initial encounter: Secondary | ICD-10-CM

## 2016-04-10 DIAGNOSIS — G931 Anoxic brain damage, not elsewhere classified: Secondary | ICD-10-CM | POA: Diagnosis present

## 2016-04-10 DIAGNOSIS — J9690 Respiratory failure, unspecified, unspecified whether with hypoxia or hypercapnia: Secondary | ICD-10-CM

## 2016-04-10 DIAGNOSIS — Z9911 Dependence on respirator [ventilator] status: Secondary | ICD-10-CM

## 2016-04-10 DIAGNOSIS — N179 Acute kidney failure, unspecified: Secondary | ICD-10-CM | POA: Diagnosis present

## 2016-04-10 DIAGNOSIS — S272XXA Traumatic hemopneumothorax, initial encounter: Secondary | ICD-10-CM

## 2016-04-10 DIAGNOSIS — Z9889 Other specified postprocedural states: Secondary | ICD-10-CM

## 2016-04-10 DIAGNOSIS — T17908A Unspecified foreign body in respiratory tract, part unspecified causing other injury, initial encounter: Secondary | ICD-10-CM

## 2016-04-10 DIAGNOSIS — G936 Cerebral edema: Secondary | ICD-10-CM | POA: Diagnosis present

## 2016-04-10 DIAGNOSIS — S31811A Laceration without foreign body of right buttock, initial encounter: Secondary | ICD-10-CM | POA: Diagnosis present

## 2016-04-10 DIAGNOSIS — J9811 Atelectasis: Secondary | ICD-10-CM | POA: Diagnosis present

## 2016-04-10 DIAGNOSIS — S21139A Puncture wound without foreign body of unspecified front wall of thorax without penetration into thoracic cavity, initial encounter: Secondary | ICD-10-CM

## 2016-04-10 DIAGNOSIS — G253 Myoclonus: Secondary | ICD-10-CM | POA: Diagnosis present

## 2016-04-10 DIAGNOSIS — R0682 Tachypnea, not elsewhere classified: Secondary | ICD-10-CM

## 2016-04-10 DIAGNOSIS — J9601 Acute respiratory failure with hypoxia: Secondary | ICD-10-CM | POA: Diagnosis present

## 2016-04-10 DIAGNOSIS — G9340 Encephalopathy, unspecified: Secondary | ICD-10-CM

## 2016-04-10 DIAGNOSIS — S299XXA Unspecified injury of thorax, initial encounter: Secondary | ICD-10-CM

## 2016-04-10 DIAGNOSIS — S36113A Laceration of liver, unspecified degree, initial encounter: Principal | ICD-10-CM | POA: Diagnosis present

## 2016-04-10 DIAGNOSIS — S271XXA Traumatic hemothorax, initial encounter: Secondary | ICD-10-CM | POA: Diagnosis present

## 2016-04-10 DIAGNOSIS — R29898 Other symptoms and signs involving the musculoskeletal system: Secondary | ICD-10-CM

## 2016-04-10 DIAGNOSIS — J942 Hemothorax: Secondary | ICD-10-CM

## 2016-04-10 HISTORY — PX: PERICARDIAL WINDOW: SHX2213

## 2016-04-10 HISTORY — PX: LIVER REPAIR: SHX6734

## 2016-04-10 HISTORY — DX: Other specified health status: Z78.9

## 2016-04-10 HISTORY — PX: LAPAROTOMY: SHX154

## 2016-04-10 LAB — I-STAT CHEM 8, ED
BUN: 12 mg/dL (ref 6–20)
CALCIUM ION: 1.08 mmol/L — AB (ref 1.15–1.40)
CHLORIDE: 106 mmol/L (ref 101–111)
Creatinine, Ser: 1.3 mg/dL — ABNORMAL HIGH (ref 0.61–1.24)
GLUCOSE: 213 mg/dL — AB (ref 65–99)
HCT: 44 % (ref 39.0–52.0)
HEMOGLOBIN: 15 g/dL (ref 13.0–17.0)
Potassium: 3.9 mmol/L (ref 3.5–5.1)
SODIUM: 143 mmol/L (ref 135–145)
TCO2: 17 mmol/L (ref 0–100)

## 2016-04-10 LAB — COMPREHENSIVE METABOLIC PANEL
ALBUMIN: 3.4 g/dL — AB (ref 3.5–5.0)
ALT: 305 U/L — ABNORMAL HIGH (ref 17–63)
ANION GAP: 20 — AB (ref 5–15)
AST: 183 U/L — AB (ref 15–41)
Alkaline Phosphatase: 91 U/L (ref 38–126)
BUN: 12 mg/dL (ref 6–20)
CHLORIDE: 107 mmol/L (ref 101–111)
CO2: 14 mmol/L — ABNORMAL LOW (ref 22–32)
Calcium: 8.7 mg/dL — ABNORMAL LOW (ref 8.9–10.3)
Creatinine, Ser: 1.49 mg/dL — ABNORMAL HIGH (ref 0.61–1.24)
GFR calc Af Amer: >60 mL/min (ref >60)
GFR calc non Af Amer: 59 mL/min — ABNORMAL LOW (ref >60)
GLUCOSE: 220 mg/dL — AB (ref 65–99)
POTASSIUM: 4.1 mmol/L (ref 3.5–5.1)
SODIUM: 141 mmol/L (ref 135–145)
Total Bilirubin: 0.6 mg/dL (ref 0.3–1.2)
Total Protein: 6.4 g/dL — ABNORMAL LOW (ref 6.5–8.1)

## 2016-04-10 LAB — CBC
HCT: 30.9 % — ABNORMAL LOW (ref 39.0–52.0)
HEMATOCRIT: 42.1 % (ref 39.0–52.0)
HEMOGLOBIN: 10.3 g/dL — AB (ref 13.0–17.0)
Hemoglobin: 13.9 g/dL (ref 13.0–17.0)
MCH: 29.4 pg (ref 26.0–34.0)
MCH: 29.2 pg (ref 26.0–34.0)
MCHC: 33 g/dL (ref 30.0–36.0)
MCHC: 33.3 g/dL (ref 30.0–36.0)
MCV: 89 fL (ref 78.0–100.0)
MCV: 87.5 fL (ref 78.0–100.0)
Platelets: 275 10*3/uL (ref 150–400)
Platelets: 129 10*3/uL — ABNORMAL LOW (ref 150–400)
RBC: 4.73 MIL/uL (ref 4.22–5.81)
RBC: 3.53 MIL/uL — AB (ref 4.22–5.81)
RDW: 12.5 % (ref 11.5–15.5)
RDW: 13.6 % (ref 11.5–15.5)
WBC: 10.6 10*3/uL — ABNORMAL HIGH (ref 4.0–10.5)
WBC: 17.3 10*3/uL — ABNORMAL HIGH (ref 4.0–10.5)

## 2016-04-10 LAB — MASSIVE TRANSFUSION PROTOCOL ORDER (BLOOD BANK NOTIFICATION)

## 2016-04-10 LAB — ETHANOL: Alcohol, Ethyl (B): <5 mg/dL (ref <5)

## 2016-04-10 LAB — CDS SEROLOGY

## 2016-04-10 LAB — FIBRINOGEN: FIBRINOGEN: 208 mg/dL — AB (ref 210–475)

## 2016-04-10 LAB — I-STAT CG4 LACTIC ACID, ED: Lactic Acid, Venous: 12.52 mmol/L (ref 0.5–1.9)

## 2016-04-10 LAB — PROTIME-INR
INR: 1.11
Prothrombin Time: 14.4 seconds (ref 11.4–15.2)

## 2016-04-10 SURGERY — CREATION, PERICARDIAL WINDOW
Anesthesia: General | Site: Chest

## 2016-04-10 MED ORDER — 0.9 % SODIUM CHLORIDE (POUR BTL) OPTIME
TOPICAL | Status: DC | PRN
  Administered 2016-04-10: 1000 mL

## 2016-04-10 MED ORDER — DEXTROSE 5 % IV SOLN
1.0000 g | Freq: Once | INTRAVENOUS | Status: AC
  Administered 2016-04-10: 1 g via INTRAVENOUS
  Filled 2016-04-10: qty 1

## 2016-04-10 MED ORDER — MIDAZOLAM HCL 2 MG/2ML IJ SOLN
INTRAMUSCULAR | Status: AC
  Filled 2016-04-10: qty 2

## 2016-04-10 MED ORDER — PROPOFOL 1000 MG/100ML IV EMUL
INTRAVENOUS | Status: AC | PRN
  Administered 2016-04-10: 30 ug/kg/min via INTRAVENOUS

## 2016-04-10 MED ORDER — ETOMIDATE 2 MG/ML IV SOLN
INTRAVENOUS | Status: AC | PRN
  Administered 2016-04-10: 20 mg via INTRAVENOUS

## 2016-04-10 MED ORDER — MIDAZOLAM HCL 2 MG/2ML IJ SOLN
INTRAMUSCULAR | Status: AC
  Filled 2016-04-10: qty 4

## 2016-04-10 MED ORDER — SODIUM CHLORIDE 0.9 % IV SOLN: 10.0000 mL/h | Freq: Once | INTRAVENOUS | Status: DC

## 2016-04-10 MED ORDER — FENTANYL CITRATE (PF) 100 MCG/2ML IJ SOLN
INTRAMUSCULAR | Status: AC
  Filled 2016-04-10: qty 6

## 2016-04-10 MED ORDER — FENTANYL CITRATE (PF) 100 MCG/2ML IJ SOLN
INTRAMUSCULAR | Status: AC
  Filled 2016-04-10: qty 2

## 2016-04-10 MED ORDER — CEFAZOLIN SODIUM-DEXTROSE 2-4 GM/100ML-% IV SOLN
INTRAVENOUS | Status: AC
  Administered 2016-04-10: 2000 mg
  Filled 2016-04-10: qty 100

## 2016-04-10 MED ORDER — CALCIUM CHLORIDE 10 % IV SOLN
INTRAVENOUS | Status: DC | PRN
  Administered 2016-04-10: 300 mg via INTRAVENOUS

## 2016-04-10 MED ORDER — TETANUS-DIPHTH-ACELL PERTUSSIS 5-2.5-18.5 LF-MCG/0.5 IM SUSP
INTRAMUSCULAR | Status: AC
  Administered 2016-04-10: 0.5 mL via INTRAMUSCULAR
  Filled 2016-04-10: qty 0.5

## 2016-04-10 MED ORDER — FENTANYL CITRATE (PF) 100 MCG/2ML IJ SOLN
INTRAMUSCULAR | Status: AC | PRN
  Administered 2016-04-10: 100 ug via INTRAVENOUS

## 2016-04-10 MED ORDER — MIDAZOLAM HCL 5 MG/5ML IJ SOLN
INTRAMUSCULAR | Status: AC | PRN
  Administered 2016-04-10: 4 mg via INTRAVENOUS

## 2016-04-10 MED ORDER — SODIUM BICARBONATE 8.4 % IV SOLN
INTRAVENOUS | Status: DC | PRN
  Administered 2016-04-10: 50 meq via INTRAVENOUS

## 2016-04-10 MED ORDER — SODIUM CHLORIDE 0.9 % IV SOLN
INTRAVENOUS | Status: DC | PRN
  Administered 2016-04-10 – 2016-04-11 (×3): 1000 mL via INTRAVENOUS

## 2016-04-10 MED ORDER — MIDAZOLAM HCL 5 MG/5ML IJ SOLN
INTRAMUSCULAR | Status: DC | PRN
  Administered 2016-04-10 (×3): 2 mg via INTRAVENOUS

## 2016-04-10 MED ORDER — FENTANYL CITRATE (PF) 100 MCG/2ML IJ SOLN
INTRAMUSCULAR | Status: DC | PRN
  Administered 2016-04-10 (×2): 100 ug via INTRAVENOUS
  Administered 2016-04-10 (×2): 50 ug via INTRAVENOUS
  Administered 2016-04-10 (×2): 100 ug via INTRAVENOUS

## 2016-04-10 MED ORDER — PROPOFOL 10 MG/ML IV BOLUS
INTRAVENOUS | Status: AC | PRN
  Administered 2016-04-10: 20 ug via INTRAVENOUS

## 2016-04-10 MED ORDER — SUCCINYLCHOLINE CHLORIDE 20 MG/ML IJ SOLN
INTRAMUSCULAR | Status: AC | PRN
  Administered 2016-04-10: 100 mg via INTRAVENOUS

## 2016-04-10 MED ORDER — ROCURONIUM BROMIDE 100 MG/10ML IV SOLN
INTRAVENOUS | Status: DC | PRN
  Administered 2016-04-10: 30 mg via INTRAVENOUS
  Administered 2016-04-10: 70 mg via INTRAVENOUS
  Administered 2016-04-10: 30 mg via INTRAVENOUS

## 2016-04-10 SURGICAL SUPPLY — 44 items
BLADE STERNUM SYSTEM 6 (BLADE) ×4 IMPLANT
BLADE SURG ROTATE 9660 (MISCELLANEOUS) IMPLANT
CANISTER SUCTION 2500CC (MISCELLANEOUS) ×4 IMPLANT
CHLORAPREP W/TINT 26ML (MISCELLANEOUS) ×4 IMPLANT
COVER SURGICAL LIGHT HANDLE (MISCELLANEOUS) ×4 IMPLANT
DRAIN CHANNEL 32F RND 10.7 FF (WOUND CARE) ×4 IMPLANT
DRAPE LAPAROSCOPIC ABDOMINAL (DRAPES) ×4 IMPLANT
DRAPE WARM FLUID 44X44 (DRAPE) ×4 IMPLANT
DRSG OPSITE POSTOP 4X10 (GAUZE/BANDAGES/DRESSINGS) IMPLANT
DRSG OPSITE POSTOP 4X8 (GAUZE/BANDAGES/DRESSINGS) IMPLANT
DRSG VAC ATS LRG SENSATRAC (GAUZE/BANDAGES/DRESSINGS) ×4 IMPLANT
ELECT BLADE 6.5 EXT (BLADE) IMPLANT
ELECT CAUTERY BLADE 6.4 (BLADE) ×4 IMPLANT
ELECT REM PT RETURN 9FT ADLT (ELECTROSURGICAL) ×4
ELECTRODE REM PT RTRN 9FT ADLT (ELECTROSURGICAL) ×2 IMPLANT
GLOVE BIO SURGEON STRL SZ7.5 (GLOVE) ×4 IMPLANT
GLOVE BIOGEL PI IND STRL 8 (GLOVE) ×2 IMPLANT
GLOVE BIOGEL PI INDICATOR 8 (GLOVE) ×2
GOWN STRL REUS W/ TWL LRG LVL3 (GOWN DISPOSABLE) ×2 IMPLANT
GOWN STRL REUS W/ TWL XL LVL3 (GOWN DISPOSABLE) ×2 IMPLANT
GOWN STRL REUS W/TWL LRG LVL3 (GOWN DISPOSABLE) ×2
GOWN STRL REUS W/TWL XL LVL3 (GOWN DISPOSABLE) ×2
KIT BASIN OR (CUSTOM PROCEDURE TRAY) ×4 IMPLANT
KIT PLEURX DRAIN CATH 500ML (KITS) ×8 IMPLANT
KIT ROOM TURNOVER OR (KITS) ×4 IMPLANT
LIGASURE IMPACT 36 18CM CVD LR (INSTRUMENTS) IMPLANT
NS IRRIG 1000ML POUR BTL (IV SOLUTION) ×8 IMPLANT
PACK CHEST (CUSTOM PROCEDURE TRAY) ×4 IMPLANT
PACK GENERAL/GYN (CUSTOM PROCEDURE TRAY) ×4 IMPLANT
PAD ARMBOARD 7.5X6 YLW CONV (MISCELLANEOUS) ×4 IMPLANT
SPECIMEN JAR LARGE (MISCELLANEOUS) IMPLANT
SPONGE LAP 18X18 X RAY DECT (DISPOSABLE) ×16 IMPLANT
STAPLER VISISTAT 35W (STAPLE) ×4 IMPLANT
SUCTION POOLE TIP (SUCTIONS) ×4 IMPLANT
SUT ETHILON 2 0 FS 18 (SUTURE) ×4 IMPLANT
SUT PDS AB 1 TP1 96 (SUTURE) ×8 IMPLANT
SUT SILK 2 0 SH CR/8 (SUTURE) ×4 IMPLANT
SUT SILK 2 0 TIES 10X30 (SUTURE) ×4 IMPLANT
SUT SILK 3 0 SH CR/8 (SUTURE) ×4 IMPLANT
SUT SILK 3 0 TIES 10X30 (SUTURE) ×4 IMPLANT
TOWEL OR 17X24 6PK STRL BLUE (TOWEL DISPOSABLE) ×4 IMPLANT
TOWEL OR 17X26 10 PK STRL BLUE (TOWEL DISPOSABLE) ×4 IMPLANT
TRAY FOLEY CATH 16FRSI W/METER (SET/KITS/TRAYS/PACK) ×4 IMPLANT
YANKAUER SUCT BULB TIP NO VENT (SUCTIONS) IMPLANT

## 2016-04-10 NOTE — Progress Notes (Signed)
Orthopedic Tech Progress Note Patient Details:  Nathen Maymmitt XXXWatkins 11/06/1980 [id number] Level 1 trauma ortho visit. Patient ID: Nathen Maymmitt XXXWatkins, male   DOB: 11/06/1980, 35 y.o.   MRN: [id number]   Jennye MoccasinHughes, Shalayah Beagley Craig 04/10/2016, 10:17 PM

## 2016-04-10 NOTE — ED Notes (Signed)
Going to OR at this time with Arlice ColtMorgan RN, Trauma MD and EMT
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
## 2016-04-10 NOTE — ED Notes (Signed)
2nd unit of plasma started 

## 2016-04-10 NOTE — ED Notes (Signed)
1st unit of plasma started
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
## 2016-04-10 NOTE — ED Notes (Signed)
1st unit of plasma complete
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
## 2016-04-10 NOTE — ED Notes (Signed)
Emergency Release blood started

## 2016-04-10 NOTE — ED Notes (Signed)
1st FAST exam by Dr Verdie MosherLiu is negative

## 2016-04-10 NOTE — ED Notes (Signed)
2nd FAST exam positive

## 2016-04-10 NOTE — ED Notes (Signed)
2nd unit of plasma complete

## 2016-04-11 ENCOUNTER — Inpatient Hospital Stay (HOSPITAL_COMMUNITY): Payer: Self-pay

## 2016-04-11 ENCOUNTER — Encounter (HOSPITAL_COMMUNITY): Payer: Self-pay | Admitting: General Surgery

## 2016-04-11 DIAGNOSIS — W3400XA Accidental discharge from unspecified firearms or gun, initial encounter: Secondary | ICD-10-CM

## 2016-04-11 DIAGNOSIS — J9601 Acute respiratory failure with hypoxia: Secondary | ICD-10-CM

## 2016-04-11 LAB — PREPARE FRESH FROZEN PLASMA
UNIT DIVISION: 0
UNIT DIVISION: 0
UNIT DIVISION: 0
UNIT DIVISION: 0
UNIT DIVISION: 0
UNIT DIVISION: 0
Unit division: 0
Unit division: 0
Unit division: 0
Unit division: 0
Unit division: 0
Unit division: 0
Unit division: 0
Unit division: 0
Unit division: 0
Unit division: 0
Unit division: 0

## 2016-04-11 LAB — GLUCOSE, CAPILLARY
GLUCOSE-CAPILLARY: 122 mg/dL — AB (ref 65–99)
GLUCOSE-CAPILLARY: 143 mg/dL — AB (ref 65–99)
GLUCOSE-CAPILLARY: 107 mg/dL — AB (ref 65–99)
Glucose-Capillary: 109 mg/dL — ABNORMAL HIGH (ref 65–99)
Glucose-Capillary: 102 mg/dL — ABNORMAL HIGH (ref 65–99)
Glucose-Capillary: 105 mg/dL — ABNORMAL HIGH (ref 65–99)

## 2016-04-11 LAB — PREPARE PLATELET PHERESIS: UNIT DIVISION: 0

## 2016-04-11 LAB — BLOOD GAS, ARTERIAL
Acid-base deficit: 1.9 mmol/L (ref 0.0–2.0)
Bicarbonate: 24.2 mmol/L (ref 20.0–28.0)
DRAWN BY: 449561
FIO2: 100
O2 SAT: 96.1 %
PCO2 ART: 51.4 mmHg — AB (ref 32.0–48.0)
PEEP: 10 cmH2O
PH ART: 7.283 — AB (ref 7.350–7.450)
Patient temperature: 95.5
RATE: 18 resp/min
VT: 580 mL
pO2, Arterial: 80.7 mmHg — ABNORMAL LOW (ref 83.0–108.0)

## 2016-04-11 LAB — LACTIC ACID, PLASMA: Lactic Acid, Venous: 2.3 mmol/L (ref 0.5–1.9)

## 2016-04-11 LAB — MRSA PCR SCREENING: MRSA BY PCR: NEGATIVE

## 2016-04-11 LAB — URINALYSIS, ROUTINE W REFLEX MICROSCOPIC
BILIRUBIN URINE: NEGATIVE
GLUCOSE, UA: NEGATIVE mg/dL
KETONES UR: NEGATIVE mg/dL
NITRITE: NEGATIVE
PH: 5.5 (ref 5.0–8.0)
Protein, ur: NEGATIVE mg/dL
SPECIFIC GRAVITY, URINE: 1.024 (ref 1.005–1.030)

## 2016-04-11 LAB — RAPID HIV SCREEN (HIV 1/2 AB+AG)
HIV 1/2 ANTIBODIES: NONREACTIVE
HIV-1 P24 ANTIGEN - HIV24: NONREACTIVE

## 2016-04-11 LAB — URINE MICROSCOPIC-ADD ON

## 2016-04-11 LAB — POCT I-STAT 7, (LYTES, BLD GAS, ICA,H+H)
ACID-BASE DEFICIT: 4 mmol/L — AB (ref 0.0–2.0)
Acid-base deficit: 5 mmol/L — ABNORMAL HIGH (ref 0.0–2.0)
Acid-base deficit: 9 mmol/L — ABNORMAL HIGH (ref 0.0–2.0)
BICARBONATE: 20.1 mmol/L (ref 20.0–28.0)
BICARBONATE: 24.2 mmol/L (ref 20.0–28.0)
Bicarbonate: 22.9 mmol/L (ref 20.0–28.0)
CALCIUM ION: 0.95 mmol/L — AB (ref 1.15–1.40)
CALCIUM ION: 1 mmol/L — AB (ref 1.15–1.40)
Calcium, Ion: 1.03 mmol/L — ABNORMAL LOW (ref 1.15–1.40)
HEMATOCRIT: 28 % — AB (ref 39.0–52.0)
HEMATOCRIT: 30 % — AB (ref 39.0–52.0)
HEMATOCRIT: 37 % — AB (ref 39.0–52.0)
HEMOGLOBIN: 9.5 g/dL — AB (ref 13.0–17.0)
Hemoglobin: 10.2 g/dL — ABNORMAL LOW (ref 13.0–17.0)
Hemoglobin: 12.6 g/dL — ABNORMAL LOW (ref 13.0–17.0)
O2 SAT: 81 %
O2 Saturation: 90 %
O2 Saturation: 94 %
PCO2 ART: 54.1 mmHg — AB (ref 32.0–48.0)
PCO2 ART: 57.1 mmHg — AB (ref 32.0–48.0)
PO2 ART: 90 mmHg (ref 83.0–108.0)
POTASSIUM: 4.8 mmol/L (ref 3.5–5.1)
POTASSIUM: 5.5 mmol/L — AB (ref 3.5–5.1)
Patient temperature: 35.1
Patient temperature: 36
Potassium: 4.2 mmol/L (ref 3.5–5.1)
SODIUM: 141 mmol/L (ref 135–145)
SODIUM: 142 mmol/L (ref 135–145)
SODIUM: 142 mmol/L (ref 135–145)
TCO2: 22 mmol/L (ref 0–100)
TCO2: 25 mmol/L (ref 0–100)
TCO2: 26 mmol/L (ref 0–100)
pCO2 arterial: 54 mmHg — ABNORMAL HIGH (ref 32.0–48.0)
pH, Arterial: 7.147 — CL (ref 7.350–7.450)
pH, Arterial: 7.225 — ABNORMAL LOW (ref 7.350–7.450)
pH, Arterial: 7.249 — ABNORMAL LOW (ref 7.350–7.450)
pO2, Arterial: 49 mmHg — ABNORMAL LOW (ref 83.0–108.0)
pO2, Arterial: 64 mmHg — ABNORMAL LOW (ref 83.0–108.0)

## 2016-04-11 LAB — BASIC METABOLIC PANEL
ANION GAP: 6 (ref 5–15)
BUN: 10 mg/dL (ref 6–20)
CO2: 24 mmol/L (ref 22–32)
Calcium: 7.7 mg/dL — ABNORMAL LOW (ref 8.9–10.3)
Chloride: 110 mmol/L (ref 101–111)
Creatinine, Ser: 1.01 mg/dL (ref 0.61–1.24)
GLUCOSE: 136 mg/dL — AB (ref 65–99)
POTASSIUM: 4.4 mmol/L (ref 3.5–5.1)
Sodium: 140 mmol/L (ref 135–145)

## 2016-04-11 LAB — POCT I-STAT 3, ART BLOOD GAS (G3+)
ACID-BASE DEFICIT: 6 mmol/L — AB (ref 0.0–2.0)
BICARBONATE: 22.8 mmol/L (ref 20.0–28.0)
O2 Saturation: 50 %
PH ART: 7.219 — AB (ref 7.350–7.450)
TCO2: 24 mmol/L (ref 0–100)
pCO2 arterial: 55.3 mmHg — ABNORMAL HIGH (ref 32.0–48.0)
pO2, Arterial: 31 mmHg — CL (ref 83.0–108.0)

## 2016-04-11 LAB — PREPARE RBC (CROSSMATCH)

## 2016-04-11 LAB — DIC (DISSEMINATED INTRAVASCULAR COAGULATION)PANEL
D-Dimer, Quant: >20 ug/mL-FEU — ABNORMAL HIGH (ref 0.00–0.50)
Fibrinogen: 274 mg/dL (ref 210–475)
INR: 1.22
Platelets: 137 10*3/uL — ABNORMAL LOW (ref 150–400)
aPTT: 28 seconds (ref 24–36)

## 2016-04-11 LAB — CBC
HEMATOCRIT: 46.8 % (ref 39.0–52.0)
Hemoglobin: 15.6 g/dL (ref 13.0–17.0)
MCH: 29.5 pg (ref 26.0–34.0)
MCHC: 33.3 g/dL (ref 30.0–36.0)
MCV: 88.5 fL (ref 78.0–100.0)
PLATELETS: 134 10*3/uL — AB (ref 150–400)
RBC: 5.29 MIL/uL (ref 4.22–5.81)
RDW: 14 % (ref 11.5–15.5)
WBC: 13.8 10*3/uL — AB (ref 4.0–10.5)

## 2016-04-11 LAB — DIC (DISSEMINATED INTRAVASCULAR COAGULATION) PANEL
PROTHROMBIN TIME: 15.5 s — AB (ref 11.4–15.2)
SMEAR REVIEW: NONE SEEN

## 2016-04-11 LAB — PROTIME-INR
INR: 1.13
INR: 1.19
Prothrombin Time: 14.6 seconds (ref 11.4–15.2)
Prothrombin Time: 15.2 seconds (ref 11.4–15.2)

## 2016-04-11 LAB — ABO/RH: ABO/RH(D): A POS

## 2016-04-11 LAB — TRIGLYCERIDES: TRIGLYCERIDES: 108 mg/dL (ref <150)

## 2016-04-11 LAB — APTT: APTT: 27 s (ref 24–36)

## 2016-04-11 MED ORDER — FENTANYL CITRATE (PF) 100 MCG/2ML IJ SOLN: 100.0000 ug | Freq: Once | INTRAMUSCULAR | Status: DC | PRN

## 2016-04-11 MED ORDER — SODIUM CHLORIDE 0.9 % IV SOLN
25.0000 ug/h | INTRAVENOUS | Status: DC
  Administered 2016-04-11: 100 ug/h via INTRAVENOUS
  Filled 2016-04-11: qty 50

## 2016-04-11 MED ORDER — DEXTROSE-NACL 5-0.9 % IV SOLN
INTRAVENOUS | Status: DC
  Administered 2016-04-11 – 2016-04-16 (×10): via INTRAVENOUS

## 2016-04-11 MED ORDER — VECURONIUM BROMIDE 10 MG IV SOLR
0.8000 ug/kg/min | INTRAVENOUS | Status: DC
  Administered 2016-04-11: 1 ug/kg/min via INTRAVENOUS
  Filled 2016-04-11 (×2): qty 100

## 2016-04-11 MED ORDER — CHLORHEXIDINE GLUCONATE 0.12% ORAL RINSE (MEDLINE KIT)
15.0000 mL | Freq: Two times a day (BID) | OROMUCOSAL | Status: DC
  Administered 2016-04-11 – 2016-04-14 (×8): 15 mL via OROMUCOSAL

## 2016-04-11 MED ORDER — ARTIFICIAL TEARS OP OINT
1.0000 "application " | TOPICAL_OINTMENT | Freq: Three times a day (TID) | OPHTHALMIC | Status: DC
  Administered 2016-04-11 – 2016-04-20 (×28): 1 via OPHTHALMIC
  Filled 2016-04-11: qty 3.5

## 2016-04-11 MED ORDER — LACTATED RINGERS IV SOLN
INTRAVENOUS | Status: DC | PRN
  Administered 2016-04-10 (×2): via INTRAVENOUS

## 2016-04-11 MED ORDER — SODIUM CHLORIDE 0.9 % IV SOLN
100.0000 ug/h | INTRAVENOUS | Status: DC
  Administered 2016-04-11 – 2016-04-12 (×2): 275 ug/h via INTRAVENOUS
  Administered 2016-04-12: 200 ug/h via INTRAVENOUS
  Administered 2016-04-13 (×2): 250 ug/h via INTRAVENOUS
  Administered 2016-04-14: 200 ug/h via INTRAVENOUS
  Administered 2016-04-15: 150 ug/h via INTRAVENOUS
  Administered 2016-04-15: 250 ug/h via INTRAVENOUS
  Administered 2016-04-15: 150 ug/h via INTRAVENOUS
  Administered 2016-04-16 – 2016-04-19 (×7): 250 ug/h via INTRAVENOUS
  Administered 2016-04-19: 300 ug/h via INTRAVENOUS
  Administered 2016-04-19: 330 ug/h via INTRAVENOUS
  Administered 2016-04-20: 400 ug/h via INTRAVENOUS
  Administered 2016-04-20: 275 ug/h via INTRAVENOUS
  Administered 2016-04-20: 325 ug/h via INTRAVENOUS
  Administered 2016-04-21: 200 ug/h via INTRAVENOUS
  Administered 2016-04-21: 325 ug/h via INTRAVENOUS
  Administered 2016-04-22: 125 ug/h via INTRAVENOUS
  Administered 2016-04-22: 200 ug/h via INTRAVENOUS
  Administered 2016-04-23 – 2016-04-24 (×3): 125 ug/h via INTRAVENOUS
  Filled 2016-04-11 (×32): qty 50

## 2016-04-11 MED ORDER — PROPOFOL 1000 MG/100ML IV EMUL
25.0000 ug/kg/min | INTRAVENOUS | Status: DC
  Administered 2016-04-11 (×4): 50 ug/kg/min via INTRAVENOUS
  Administered 2016-04-12: 45 ug/kg/min via INTRAVENOUS
  Administered 2016-04-12: 50 ug/kg/min via INTRAVENOUS
  Administered 2016-04-12 (×2): 35 ug/kg/min via INTRAVENOUS
  Administered 2016-04-12: 50 ug/kg/min via INTRAVENOUS
  Administered 2016-04-12: 35 ug/kg/min via INTRAVENOUS
  Administered 2016-04-13 (×2): 30 ug/kg/min via INTRAVENOUS
  Administered 2016-04-13 (×3): 35 ug/kg/min via INTRAVENOUS
  Administered 2016-04-14: 30 ug/kg/min via INTRAVENOUS
  Administered 2016-04-14 (×2): 20 ug/kg/min via INTRAVENOUS
  Administered 2016-04-14: 30 ug/kg/min via INTRAVENOUS
  Administered 2016-04-15 (×3): 20 ug/kg/min via INTRAVENOUS
  Administered 2016-04-16 (×2): 40 ug/kg/min via INTRAVENOUS
  Administered 2016-04-16 (×2): 20 ug/kg/min via INTRAVENOUS
  Administered 2016-04-17 (×2): 45 ug/kg/min via INTRAVENOUS
  Administered 2016-04-17 (×3): 40 ug/kg/min via INTRAVENOUS
  Administered 2016-04-17: 30 ug/kg/min via INTRAVENOUS
  Administered 2016-04-18 – 2016-04-19 (×10): 45 ug/kg/min via INTRAVENOUS
  Filled 2016-04-11 (×45): qty 100

## 2016-04-11 MED ORDER — SODIUM CHLORIDE 0.9 % IV SOLN
INTRAVENOUS | Status: DC | PRN
  Administered 2016-04-10 (×2): via INTRAVENOUS

## 2016-04-11 MED ORDER — FENTANYL CITRATE (PF) 100 MCG/2ML IJ SOLN
INTRAMUSCULAR | Status: AC
  Filled 2016-04-11: qty 2

## 2016-04-11 MED ORDER — NOREPINEPHRINE BITARTRATE 1 MG/ML IV SOLN
0.0000 ug/min | INTRAVENOUS | Status: DC
  Administered 2016-04-11: 40 ug/min via INTRAVENOUS
  Administered 2016-04-11: 60 ug/min via INTRAVENOUS
  Administered 2016-04-12: 55 ug/min via INTRAVENOUS
  Administered 2016-04-12: 60 ug/min via INTRAVENOUS
  Administered 2016-04-13: 8 ug/min via INTRAVENOUS
  Administered 2016-04-14: 10 ug/min via INTRAVENOUS
  Filled 2016-04-11 (×6): qty 16

## 2016-04-11 MED ORDER — MIDAZOLAM HCL 2 MG/2ML IJ SOLN
INTRAMUSCULAR | Status: AC
  Filled 2016-04-11: qty 2

## 2016-04-11 MED ORDER — PROPOFOL 1000 MG/100ML IV EMUL
INTRAVENOUS | Status: AC
  Filled 2016-04-11: qty 100

## 2016-04-11 MED ORDER — ROCURONIUM BROMIDE 10 MG/ML (PF) SYRINGE
PREFILLED_SYRINGE | INTRAVENOUS | Status: AC
  Filled 2016-04-11: qty 10

## 2016-04-11 MED ORDER — ALBUMIN HUMAN 5 % IV SOLN
INTRAVENOUS | Status: AC
  Filled 2016-04-11: qty 500

## 2016-04-11 MED ORDER — FENTANYL BOLUS VIA INFUSION
50.0000 ug | INTRAVENOUS | Status: DC | PRN
  Filled 2016-04-11: qty 50

## 2016-04-11 MED ORDER — FENTANYL CITRATE (PF) 100 MCG/2ML IJ SOLN: 50.0000 ug | Freq: Once | INTRAMUSCULAR | Status: DC

## 2016-04-11 MED ORDER — ALBUMIN HUMAN 5 % IV SOLN
25.0000 g | Freq: Once | INTRAVENOUS | Status: AC
  Administered 2016-04-11: 25 g via INTRAVENOUS

## 2016-04-11 MED ORDER — VECURONIUM BROMIDE 10 MG IV SOLR
INTRAVENOUS | Status: AC
Start: 2016-04-11 — End: 2016-04-11
  Administered 2016-04-11: 10 mg
  Filled 2016-04-11: qty 10

## 2016-04-11 MED ORDER — FENTANYL CITRATE (PF) 100 MCG/2ML IJ SOLN: 100.0000 ug | Freq: Once | INTRAMUSCULAR | Status: AC

## 2016-04-11 MED ORDER — PROPOFOL 1000 MG/100ML IV EMUL
0.0000 ug/kg/min | INTRAVENOUS | Status: DC
  Administered 2016-04-11: 20 ug/kg/min via INTRAVENOUS
  Administered 2016-04-11: 30 ug/kg/min via INTRAVENOUS
  Filled 2016-04-11: qty 100

## 2016-04-11 MED ORDER — PANTOPRAZOLE SODIUM 40 MG PO TBEC
40.0000 mg | DELAYED_RELEASE_TABLET | Freq: Every day | ORAL | Status: DC
  Administered 2016-04-23 – 2016-05-07 (×5): 40 mg via ORAL
  Filled 2016-04-11 (×6): qty 1

## 2016-04-11 MED ORDER — VECURONIUM BOLUS VIA INFUSION
10.0000 mg | Freq: Once | INTRAVENOUS | Status: AC
  Administered 2016-04-11: 10 mg via INTRAVENOUS
  Filled 2016-04-11: qty 10

## 2016-04-11 MED ORDER — FAMOTIDINE IN NACL 20-0.9 MG/50ML-% IV SOLN
20.0000 mg | Freq: Every day | INTRAVENOUS | Status: DC
  Administered 2016-04-11 – 2016-05-04 (×21): 20 mg via INTRAVENOUS
  Filled 2016-04-11 (×25): qty 50

## 2016-04-11 MED ORDER — NOREPINEPHRINE BITARTRATE 1 MG/ML IV SOLN
0.0000 ug/min | INTRAVENOUS | Status: DC
  Administered 2016-04-11: 45 ug/min via INTRAVENOUS
  Administered 2016-04-11: 5 ug/min via INTRAVENOUS
  Administered 2016-04-11: 35 ug/min via INTRAVENOUS
  Filled 2016-04-11 (×2): qty 4

## 2016-04-11 MED ORDER — ORAL CARE MOUTH RINSE
15.0000 mL | Freq: Four times a day (QID) | OROMUCOSAL | Status: DC
  Administered 2016-04-11 – 2016-04-14 (×14): 15 mL via OROMUCOSAL

## 2016-04-11 NOTE — Procedures (Signed)
Central Venous Catheter Insertion Procedure Note Bill [name] [id number] Jul 08, 1981  Procedure: Insertion of Central Venous Catheter Indications: Assessment of intravascular volume  Procedure Details Consent: Emergent placement Time Out: Verified patient identification, verified procedure, site/side was marked, verified correct patient position, special equipment/implants available, medications/allergies/relevent history reviewed, required imaging and test results available.  Performed  Maximum sterile technique was used including gloves, mask and sheet. Skin prep: Chlorhexidine; local anesthetic administered A antimicrobial bonded/coated triple lumen catheter was placed in the left subclavian vein using the Seldinger technique.  Evaluation Blood flow good Complications: No apparent complications Patient did tolerate procedure well. Chest X-ray ordered to verify placement.  CXR: normal.  Marigene Ehlersamirez Jr., Arthur Speagle 04/11/2016, 12:27 AM

## 2016-04-11 NOTE — Progress Notes (Signed)
Called pharmacy and notified that the strength of levophed  may not be accurate bc since switching to the "quad strength" i have had to double on dosage.  Pharmacy to send another bag.
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
## 2016-04-11 NOTE — Progress Notes (Signed)
Vent changes made per Dr Lindie SpruceWyatt at the bedside. ETT secure.
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
## 2016-04-11 NOTE — Transfer of Care (Signed)
Immediate Anesthesia Transfer of Care Note  Patient: Bill [name]  Procedure(s) Performed: Procedure(s): EXPLORATORY LAPAROTOMY (N/A) PERICARDIAL WINDOW (N/A) LIVER PACKING (N/A)  Patient Location: NICU  Anesthesia Type:General  Level of Consciousness: sedated and Patient remains intubated per anesthesia plan  Airway & Oxygen Therapy: Patient remains intubated per anesthesia plan and Patient placed on Ventilator (see vital sign flow sheet for setting)  Post-op Assessment: Report given to RN and Post -op Vital signs reviewed and stable  Post vital signs: Reviewed and stable  Last Vitals:  Vitals:   04/10/16 2221 04/10/16 2222  BP: (!) 77/45   Pulse: 107 106  Resp: 25 (!) 29  Temp:      Last Pain:  Vitals:   04/10/16 2150  PainSc: 0-No pain         Complications: No apparent anesthesia complications
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
## 2016-04-11 NOTE — Procedures (Signed)
Intubation Procedure Note Bill [name] [id number] 1981-06-20  Procedure: Intubation Indications: Airway protection and maintenance  Procedure Details Consent: Risks of procedure as well as the alternatives and risks of each were explained to the (patient/caregiver).  Consent for procedure obtained. Time Out: Verified patient identification, verified procedure, site/side was marked, verified correct patient position, special equipment/implants available, medications/allergies/relevent history reviewed, required imaging and test results available.  Performed  Maximum sterile technique was used including gloves.  MAC    Evaluation Hemodynamic Status: BP stable throughout; O2 sats: stable throughout Patient's Current Condition: stable Complications: No apparent complications Patient did tolerate procedure well. Chest X-ray ordered to verify placement.  CXR: pending.   Bill ChestnutReid, Bill [name] [facility] 04/11/2016
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
## 2016-04-11 NOTE — Clinical Social Work Note (Signed)
Clinical Child psychotherapistocial Worker received information from Consulting civil engineercharge RN regarding current social concerns.  Patient mother had contacted unit previously and phone number provided by unit secretary.  CSW contacted patient mother who states that patient is legally married, however separated at this time.  Patient mother provided CSW with patient wife Bill [name] [phone]) contact information - CSW spoke with patient wife who states that her and patient have been separated for over a year and she has already been in contact with patient mother.  Patient wife states that she would like to turn over all decision making rights to patient mother, Bill [name] ([phone]).  Patient mother and wife both provided with medical update from RN and password has been set up for further information.  Patient wife and patient mother both request that patient girlfriend receive no further medical information moving forward.  Patient mother [place] from [place] to be at bedside.  CSW to complete full assessment with patient and family once appropriate.  Macario GoldsJesse Zion Ta, [place] [phone]
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
## 2016-04-11 NOTE — Procedures (Signed)
Chest Tube Insertion Procedure Note  Indications:  Clinically significant Hypoxia  Pre-operative Diagnosis: Hypoxia  Post-operative Diagnosis: Hypoxia  Procedure Details  Informed consent was obtained for the procedure, including sedation.  Risks of lung perforation, hemorrhage, arrhythmia, and adverse drug reaction were discussed.   After sterile skin prep, using standard technique, a 24 French tube was placed in the left anterior 4th rib space.  Findings: None  Estimated Blood Loss:  Minimal         Specimens:  None              Complications:  None; patient tolerated the procedure well.         Disposition: ICU - intubated and critically ill.         Condition: unstable    Bill CaldronMichael J. Blayke Cordrey, PA-C Pager: [phone] General Trauma PA Pager: [phone]
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
## 2016-04-11 NOTE — Anesthesia Postprocedure Evaluation (Signed)
Anesthesia Post Note  Patient: Bill [name]  Procedure(s) Performed: Procedure(s) (LRB): EXPLORATORY LAPAROTOMY (N/A) PERICARDIAL WINDOW (N/A) LIVER PACKING (N/A)  Patient location during evaluation: SICU Anesthesia Type: General Level of consciousness: sedated Pain management: pain level controlled Vital Signs Assessment: post-procedure vital signs reviewed and stable Respiratory status: patient remains intubated per anesthesia plan Cardiovascular status: stable Anesthetic complications: no    Last Vitals:  Vitals:   04/11/16 0600 04/11/16 0700  BP: 97/80 100/68  Pulse: 91 (!) 104  Resp: 18 19  Temp:      Last Pain:  Vitals:   04/11/16 0400  TempSrc: Axillary  PainSc:                  Reino KentJudd, Parv Manthey J

## 2016-04-12 ENCOUNTER — Inpatient Hospital Stay (HOSPITAL_COMMUNITY): Payer: Self-pay

## 2016-04-12 LAB — HCV COMMENT:

## 2016-04-12 LAB — GLUCOSE, CAPILLARY
GLUCOSE-CAPILLARY: 150 mg/dL — AB (ref 65–99)
GLUCOSE-CAPILLARY: 138 mg/dL — AB (ref 65–99)
GLUCOSE-CAPILLARY: 109 mg/dL — AB (ref 65–99)
GLUCOSE-CAPILLARY: 112 mg/dL — AB (ref 65–99)
Glucose-Capillary: 123 mg/dL — ABNORMAL HIGH (ref 65–99)

## 2016-04-12 LAB — CBC WITH DIFFERENTIAL/PLATELET
BASOS ABS: 0 10*3/uL (ref 0.0–0.1)
Basophils Relative: 0 %
Eosinophils Absolute: 0 10*3/uL (ref 0.0–0.7)
Eosinophils Relative: 0 %
HEMATOCRIT: 40 % (ref 39.0–52.0)
Hemoglobin: 13.3 g/dL (ref 13.0–17.0)
LYMPHS ABS: 2.5 10*3/uL (ref 0.7–4.0)
Lymphocytes Relative: 14 %
MCH: 28.9 pg (ref 26.0–34.0)
MCHC: 33.3 g/dL (ref 30.0–36.0)
MCV: 86.8 fL (ref 78.0–100.0)
MONOS PCT: 9 %
Monocytes Absolute: 1.6 10*3/uL — ABNORMAL HIGH (ref 0.1–1.0)
NEUTROS ABS: 13.8 10*3/uL — AB (ref 1.7–7.7)
Neutrophils Relative %: 77 %
Platelets: 106 10*3/uL — ABNORMAL LOW (ref 150–400)
RBC: 4.61 MIL/uL (ref 4.22–5.81)
RDW: 14.7 % (ref 11.5–15.5)
WBC Morphology: INCREASED
WBC: 17.9 10*3/uL — ABNORMAL HIGH (ref 4.0–10.5)

## 2016-04-12 LAB — BASIC METABOLIC PANEL
Anion gap: 6 (ref 5–15)
BUN: 15 mg/dL (ref 6–20)
CALCIUM: 7.6 mg/dL — AB (ref 8.9–10.3)
CO2: 22 mmol/L (ref 22–32)
CREATININE: 1.82 mg/dL — AB (ref 0.61–1.24)
Chloride: 109 mmol/L (ref 101–111)
GFR, EST AFRICAN AMERICAN: 54 mL/min — AB (ref >60)
GFR, EST NON AFRICAN AMERICAN: 47 mL/min — AB (ref >60)
Glucose, Bld: 166 mg/dL — ABNORMAL HIGH (ref 65–99)
Potassium: 4.8 mmol/L (ref 3.5–5.1)
Sodium: 137 mmol/L (ref 135–145)

## 2016-04-12 LAB — PROTIME-INR
INR: 1.66
PROTHROMBIN TIME: 19.8 s — AB (ref 11.4–15.2)

## 2016-04-12 LAB — LACTIC ACID, PLASMA: Lactic Acid, Venous: 2.5 mmol/L (ref 0.5–1.9)

## 2016-04-12 LAB — HEPATITIS B SURFACE ANTIGEN: Hepatitis B Surface Ag: NEGATIVE

## 2016-04-12 LAB — POCT I-STAT 3, ART BLOOD GAS (G3+)
Acid-base deficit: 2 mmol/L (ref 0.0–2.0)
BICARBONATE: 23.1 mmol/L (ref 20.0–28.0)
O2 Saturation: 100 %
TCO2: 24 mmol/L (ref 0–100)
pCO2 arterial: 39.5 mmHg (ref 32.0–48.0)
pH, Arterial: 7.377 (ref 7.350–7.450)
pO2, Arterial: 200 mmHg — ABNORMAL HIGH (ref 83.0–108.0)

## 2016-04-12 LAB — HEPATITIS C ANTIBODY (REFLEX)

## 2016-04-12 MED ORDER — SODIUM CHLORIDE 0.9 % IV SOLN: Freq: Once | INTRAVENOUS | Status: DC

## 2016-04-12 MED ORDER — SODIUM CHLORIDE 0.9 % IV SOLN
500.0000 mL | Freq: Once | INTRAVENOUS | Status: AC
  Administered 2016-04-12: 500 mL via INTRAVENOUS

## 2016-04-12 MED ORDER — ALBUMIN HUMAN 5 % IV SOLN
12.5000 g | Freq: Once | INTRAVENOUS | Status: AC
Start: 2016-04-12 — End: 2016-04-12
  Administered 2016-04-12: 12.5 g via INTRAVENOUS

## 2016-04-12 MED ORDER — ALBUMIN HUMAN 5 % IV SOLN
INTRAVENOUS | Status: AC
  Filled 2016-04-12: qty 250

## 2016-04-12 NOTE — Procedures (Signed)
Pt transported to CT then back to room without complications.  

## 2016-04-12 NOTE — Progress Notes (Signed)
      [address]       [place] [id number]             [phone]        2 Days Post-Op Procedure(s) (LRB): EXPLORATORY LAPAROTOMY (N/A) PERICARDIAL WINDOW (N/A) LIVER PACKING (N/A)  Subjective: Sedated on vent  Objective: Vital signs in last 24 hours: Temp:  [97.7 F (36.5 C)-100.7 F (38.2 C)] 97.7 F (36.5 C) (09/21 1200) Pulse Rate:  [100-119] 104 (09/21 1300) Cardiac Rhythm: Sinus tachycardia (09/21 1300) Resp:  [30] 30 (09/21 1300) BP: (88-128)/(49-93) 117/64 (09/21 1300) SpO2:  [96 %-100 %] 100 % (09/21 1300) Arterial Line BP: (84-162)/(44-69) 104/55 (09/21 0800) FiO2 (%):  [50 %-70 %] 50 % (09/21 1300)   Current Weight  04/11/16 241 lb 13.5 oz (109.7 kg)    Hemodynamic parameters for last 24 hours: CVP:  [12 mmHg-19 mmHg] 19 mmHg  Intake/Output from previous day: 09/20 0701 - 09/21 0700 In: 6012.4 [I.V.:5712.4; IV Piggyback:300] Out: 2109 [Urine:965; Emesis/NG output:100; Drains:550; Chest Tube:494]   Physical Exam:  Cardiovascular: Slightly tachy in low 100's Pulmonary: Coarse breath sounds mostly at bases Wounds: VAC in place mid abdomen  Lab Results: CBC: Recent Labs  04/11/16 0556 04/12/16 0546  WBC 13.8* 17.9*  HGB 15.6 13.3  HCT 46.8 40.0  PLT 134* 106*   BMET:  Recent Labs  04/11/16 0556 04/12/16 0546  NA 140 137  K 4.4 4.8  CL 110 109  CO2 24 22  GLUCOSE 136* 166*  BUN 10 15  CREATININE 1.01 1.82*  CALCIUM 7.7* 7.6*    PT/INR:  Lab Results  Component Value Date   INR 1.66 04/12/2016   INR 1.19 04/11/2016   INR 1.22 04/11/2016   ABG:  INR: Will add last result for INR, ABG once components are confirmed Will add last 4 CBG results once components are confirmed  Assessment/Plan:  1. CV - Pericardial drain with scant output last 24 hours. Hope to remove soon-may be done upon return to OR. 2.  Pulmonary - Chest tubes with slightly less than 500 cc last 24 hours. Right pleural chest tube is  starting to slow down. CXR this am shows no pneumothorax and right airspace disease (atelectasis,contusion). Chest tubes to remain for now. Vent management per trauma 3. Fever to 100.7 and WBC increased to 17,900. On Cefazolin and Cefotan. Going to CT scan for abdomen and pelvis.  ZIMMERMAN,DONIELLE MPA-C 04/12/2016,1:16 PM
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
## 2016-04-12 NOTE — Progress Notes (Signed)
Trauma MD paged for hypotension. Patient has been maxed out on Levophed for majority of the night. Patient's blood pressure then became labile, now patient with MAP of 55. Order placed to administer 1 bottle of albumin as directed by Dr. Harlon Florseui. Will continue to monitor patient.

## 2016-04-12 NOTE — Progress Notes (Signed)
Two RT's attempted ALine twice on each arm with no success threading catheter. Doppler used. Will make MD and RN aware.

## 2016-04-12 NOTE — Progress Notes (Signed)
RT Note: Pt transported to CT and back to 3M11 on vent, no complications.

## 2016-04-12 NOTE — Progress Notes (Signed)
Let CT dept know we were ready to take pt down.  States, "will send transport.
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
## 2016-04-13 ENCOUNTER — Inpatient Hospital Stay (HOSPITAL_COMMUNITY): Payer: Self-pay

## 2016-04-13 ENCOUNTER — Encounter (HOSPITAL_COMMUNITY): Admission: EM | Disposition: A | Discharge status: Rehabilitation Facility | Payer: Self-pay | Source: Home / Self Care

## 2016-04-13 ENCOUNTER — Inpatient Hospital Stay (HOSPITAL_COMMUNITY): Payer: Self-pay | Admitting: Anesthesiology

## 2016-04-13 DIAGNOSIS — I312 Hemopericardium, not elsewhere classified: Secondary | ICD-10-CM

## 2016-04-13 DIAGNOSIS — S21139A Puncture wound without foreign body of unspecified front wall of thorax without penetration into thoracic cavity, initial encounter: Secondary | ICD-10-CM

## 2016-04-13 DIAGNOSIS — W3400XA Accidental discharge from unspecified firearms or gun, initial encounter: Secondary | ICD-10-CM

## 2016-04-13 DIAGNOSIS — Z789 Other specified health status: Secondary | ICD-10-CM

## 2016-04-13 DIAGNOSIS — J9601 Acute respiratory failure with hypoxia: Secondary | ICD-10-CM

## 2016-04-13 DIAGNOSIS — J942 Hemothorax: Secondary | ICD-10-CM

## 2016-04-13 DIAGNOSIS — S299XXA Unspecified injury of thorax, initial encounter: Secondary | ICD-10-CM

## 2016-04-13 DIAGNOSIS — Z4682 Encounter for fitting and adjustment of non-vascular catheter: Secondary | ICD-10-CM

## 2016-04-13 DIAGNOSIS — J9811 Atelectasis: Secondary | ICD-10-CM

## 2016-04-13 DIAGNOSIS — Z419 Encounter for procedure for purposes other than remedying health state, unspecified: Secondary | ICD-10-CM

## 2016-04-13 DIAGNOSIS — G9340 Encephalopathy, unspecified: Secondary | ICD-10-CM

## 2016-04-13 DIAGNOSIS — N179 Acute kidney failure, unspecified: Secondary | ICD-10-CM

## 2016-04-13 DIAGNOSIS — J939 Pneumothorax, unspecified: Secondary | ICD-10-CM

## 2016-04-13 HISTORY — PX: IRRIGATION AND DEBRIDEMENT ABDOMEN: SHX6600

## 2016-04-13 LAB — POCT I-STAT 3, ART BLOOD GAS (G3+)
ACID-BASE DEFICIT: 4 mmol/L — AB (ref 0.0–2.0)
Bicarbonate: 23 mmol/L (ref 20.0–28.0)
O2 SAT: 90 %
PH ART: 7.266 — AB (ref 7.350–7.450)
PO2 ART: 77 mmHg — AB (ref 83.0–108.0)
Patient temperature: 102.9
TCO2: 24 mmol/L (ref 0–100)
pCO2 arterial: 51.8 mmHg — ABNORMAL HIGH (ref 32.0–48.0)

## 2016-04-13 LAB — BLOOD GAS, ARTERIAL
Acid-base deficit: 2.4 mmol/L — ABNORMAL HIGH (ref 0.0–2.0)
BICARBONATE: 23 mmol/L (ref 20.0–28.0)
Drawn by: 280981
FIO2: 60
LHR: 30 {breaths}/min
O2 Saturation: 95.8 %
PATIENT TEMPERATURE: 98.6
PCO2 ART: 47.3 mmHg (ref 32.0–48.0)
PEEP: 10 cmH2O
VT: 440 mL
pH, Arterial: 7.307 — ABNORMAL LOW (ref 7.350–7.450)
pO2, Arterial: 79.4 mmHg — ABNORMAL LOW (ref 83.0–108.0)

## 2016-04-13 LAB — BASIC METABOLIC PANEL
ANION GAP: 6 (ref 5–15)
BUN: 15 mg/dL (ref 6–20)
CALCIUM: 8 mg/dL — AB (ref 8.9–10.3)
CO2: 24 mmol/L (ref 22–32)
CREATININE: 1.69 mg/dL — AB (ref 0.61–1.24)
Chloride: 108 mmol/L (ref 101–111)
GFR calc Af Amer: 59 mL/min — ABNORMAL LOW (ref >60)
GFR calc non Af Amer: 51 mL/min — ABNORMAL LOW (ref >60)
GLUCOSE: 160 mg/dL — AB (ref 65–99)
Potassium: 4.9 mmol/L (ref 3.5–5.1)
Sodium: 138 mmol/L (ref 135–145)

## 2016-04-13 LAB — PREPARE FRESH FROZEN PLASMA
UNIT DIVISION: 0
UNIT DIVISION: 0

## 2016-04-13 LAB — GLUCOSE, CAPILLARY
GLUCOSE-CAPILLARY: 123 mg/dL — AB (ref 65–99)
GLUCOSE-CAPILLARY: 126 mg/dL — AB (ref 65–99)
Glucose-Capillary: 143 mg/dL — ABNORMAL HIGH (ref 65–99)
Glucose-Capillary: 145 mg/dL — ABNORMAL HIGH (ref 65–99)
Glucose-Capillary: 121 mg/dL — ABNORMAL HIGH (ref 65–99)

## 2016-04-13 LAB — CBC WITH DIFFERENTIAL/PLATELET
BASOS PCT: 0 %
Basophils Absolute: 0 10*3/uL (ref 0.0–0.1)
EOS PCT: 0 %
Eosinophils Absolute: 0 10*3/uL (ref 0.0–0.7)
HEMATOCRIT: 34.3 % — AB (ref 39.0–52.0)
Hemoglobin: 11.2 g/dL — ABNORMAL LOW (ref 13.0–17.0)
LYMPHS ABS: 1.5 10*3/uL (ref 0.7–4.0)
Lymphocytes Relative: 8 %
MCH: 28.9 pg (ref 26.0–34.0)
MCHC: 32.7 g/dL (ref 30.0–36.0)
MCV: 88.4 fL (ref 78.0–100.0)
MONOS PCT: 8 %
Monocytes Absolute: 1.5 10*3/uL — ABNORMAL HIGH (ref 0.1–1.0)
NEUTROS ABS: 15.3 10*3/uL — AB (ref 1.7–7.7)
Neutrophils Relative %: 84 %
Platelets: 78 10*3/uL — ABNORMAL LOW (ref 150–400)
RBC: 3.88 MIL/uL — ABNORMAL LOW (ref 4.22–5.81)
RDW: 14.8 % (ref 11.5–15.5)
WBC: 18.3 10*3/uL — ABNORMAL HIGH (ref 4.0–10.5)

## 2016-04-13 LAB — LACTIC ACID, PLASMA: Lactic Acid, Venous: 1.9 mmol/L (ref 0.5–1.9)

## 2016-04-13 SURGERY — IRRIGATION AND DEBRIDEMENT ABDOMEN
Anesthesia: General | Site: Abdomen

## 2016-04-13 MED ORDER — GLYCOPYRROLATE 0.2 MG/ML IV SOSY
PREFILLED_SYRINGE | INTRAVENOUS | Status: AC
  Filled 2016-04-13: qty 3

## 2016-04-13 MED ORDER — ONDANSETRON HCL 4 MG/2ML IJ SOLN
INTRAMUSCULAR | Status: AC
  Filled 2016-04-13: qty 2

## 2016-04-13 MED ORDER — VANCOMYCIN HCL IN DEXTROSE 750-5 MG/150ML-% IV SOLN
750.0000 mg | Freq: Two times a day (BID) | INTRAVENOUS | Status: DC
  Administered 2016-04-13: 750 mg via INTRAVENOUS
  Filled 2016-04-13 (×3): qty 150

## 2016-04-13 MED ORDER — PROPOFOL 10 MG/ML IV BOLUS
INTRAVENOUS | Status: DC | PRN
  Administered 2016-04-13: 20 mg via INTRAVENOUS

## 2016-04-13 MED ORDER — VANCOMYCIN HCL 10 G IV SOLR
2000.0000 mg | Freq: Once | INTRAVENOUS | Status: AC
  Administered 2016-04-13: 2000 mg via INTRAVENOUS
  Filled 2016-04-13: qty 2000

## 2016-04-13 MED ORDER — NOREPINEPHRINE BITARTRATE 1 MG/ML IV SOLN
INTRAVENOUS | Status: DC | PRN
  Administered 2016-04-13: 16 ug/min via INTRAVENOUS

## 2016-04-13 MED ORDER — MIDAZOLAM HCL 2 MG/2ML IJ SOLN
INTRAMUSCULAR | Status: AC
  Filled 2016-04-13: qty 2

## 2016-04-13 MED ORDER — LACTATED RINGERS IV SOLN
INTRAVENOUS | Status: DC | PRN
  Administered 2016-04-13: 11:00:00 via INTRAVENOUS

## 2016-04-13 MED ORDER — ONDANSETRON HCL 4 MG/2ML IJ SOLN
INTRAMUSCULAR | Status: DC | PRN
  Administered 2016-04-13: 4 mg via INTRAVENOUS

## 2016-04-13 MED ORDER — PHENYLEPHRINE 40 MCG/ML (10ML) SYRINGE FOR IV PUSH (FOR BLOOD PRESSURE SUPPORT)
PREFILLED_SYRINGE | INTRAVENOUS | Status: AC
  Filled 2016-04-13: qty 10

## 2016-04-13 MED ORDER — ROCURONIUM BROMIDE 100 MG/10ML IV SOLN
INTRAVENOUS | Status: DC | PRN
  Administered 2016-04-13 (×2): 50 mg via INTRAVENOUS

## 2016-04-13 MED ORDER — ACETAMINOPHEN 325 MG PO TABS
650.0000 mg | ORAL_TABLET | Freq: Four times a day (QID) | ORAL | Status: DC | PRN
  Administered 2016-04-13 – 2016-04-23 (×10): 650 mg via ORAL
  Filled 2016-04-13 (×10): qty 2

## 2016-04-13 MED ORDER — MIDAZOLAM HCL 2 MG/2ML IJ SOLN
INTRAMUSCULAR | Status: DC | PRN
  Administered 2016-04-13: 2 mg via INTRAVENOUS

## 2016-04-13 MED ORDER — FENTANYL CITRATE (PF) 100 MCG/2ML IJ SOLN
INTRAMUSCULAR | Status: DC | PRN
  Administered 2016-04-13: 100 ug via INTRAVENOUS

## 2016-04-13 MED ORDER — PROPOFOL 500 MG/50ML IV EMUL
INTRAVENOUS | Status: DC | PRN
  Administered 2016-04-13: 50 ug/kg/min via INTRAVENOUS

## 2016-04-13 MED ORDER — INSULIN ASPART 100 UNIT/ML ~~LOC~~ SOLN
0.0000 [IU] | SUBCUTANEOUS | Status: DC
  Administered 2016-04-13 – 2016-04-26 (×29): 1 [IU] via SUBCUTANEOUS
  Administered 2016-04-27: 2 [IU] via SUBCUTANEOUS

## 2016-04-13 MED ORDER — DEXTROSE-NACL 5-0.9 % IV SOLN
INTRAVENOUS | Status: DC | PRN
  Administered 2016-04-13: 11:00:00 via INTRAVENOUS

## 2016-04-13 MED ORDER — FENTANYL CITRATE (PF) 100 MCG/2ML IJ SOLN
INTRAMUSCULAR | Status: AC
  Filled 2016-04-13: qty 2

## 2016-04-13 MED ORDER — CHLORHEXIDINE GLUCONATE 0.12 % MT SOLN
OROMUCOSAL | Status: AC
  Administered 2016-04-13: 08:00:00
  Filled 2016-04-13: qty 15

## 2016-04-13 MED ORDER — SODIUM CHLORIDE 0.9 % IV SOLN
INTRAVENOUS | Status: DC | PRN
  Administered 2016-04-13: 11:00:00 via INTRAVENOUS
  Administered 2016-04-13: 250 ug/h via INTRAVENOUS

## 2016-04-13 MED ORDER — 0.9 % SODIUM CHLORIDE (POUR BTL) OPTIME
TOPICAL | Status: DC | PRN
  Administered 2016-04-13: 1000 mL

## 2016-04-13 MED ORDER — ARTIFICIAL TEARS OP OINT
TOPICAL_OINTMENT | OPHTHALMIC | Status: DC | PRN
  Administered 2016-04-13: 1 via OPHTHALMIC

## 2016-04-13 MED ORDER — ROCURONIUM BROMIDE 10 MG/ML (PF) SYRINGE
PREFILLED_SYRINGE | INTRAVENOUS | Status: AC
  Filled 2016-04-13: qty 10

## 2016-04-13 MED ORDER — PHENYLEPHRINE HCL 10 MG/ML IJ SOLN
INTRAMUSCULAR | Status: DC | PRN
  Administered 2016-04-13: 80 ug via INTRAVENOUS
  Administered 2016-04-13: 120 ug via INTRAVENOUS
  Administered 2016-04-13: 200 ug via INTRAVENOUS

## 2016-04-13 MED ORDER — PROPOFOL 10 MG/ML IV BOLUS
INTRAVENOUS | Status: AC
  Filled 2016-04-13: qty 20

## 2016-04-13 MED ORDER — LIDOCAINE 2% (20 MG/ML) 5 ML SYRINGE
INTRAMUSCULAR | Status: AC
  Filled 2016-04-13: qty 5

## 2016-04-13 MED ORDER — FENTANYL BOLUS VIA INFUSION
25.0000 ug | INTRAVENOUS | Status: DC | PRN
  Administered 2016-04-15 (×2): 200 ug via INTRAVENOUS
  Administered 2016-04-16: 25 ug via INTRAVENOUS
  Administered 2016-04-17: 50 ug via INTRAVENOUS
  Administered 2016-04-20 (×5): 100 ug via INTRAVENOUS
  Administered 2016-04-21: 75 ug via INTRAVENOUS
  Administered 2016-04-21 – 2016-04-23 (×4): 100 ug via INTRAVENOUS
  Filled 2016-04-13 (×3): qty 100

## 2016-04-13 MED ORDER — SODIUM CHLORIDE 0.9 % IV SOLN: Freq: Once | INTRAVENOUS | Status: DC

## 2016-04-13 MED ORDER — CHLORHEXIDINE GLUCONATE CLOTH 2 % EX PADS: 6.0000 | MEDICATED_PAD | Freq: Once | CUTANEOUS | Status: AC

## 2016-04-13 SURGICAL SUPPLY — 29 items
BENZOIN TINCTURE PRP APPL 2/3 (GAUZE/BANDAGES/DRESSINGS) IMPLANT
CANISTER SUCTION 2500CC (MISCELLANEOUS) ×3 IMPLANT
CANISTER WOUND CARE 500ML ATS (WOUND CARE) ×3 IMPLANT
COVER SURGICAL LIGHT HANDLE (MISCELLANEOUS) ×3 IMPLANT
DRAIN CHANNEL 19F RND (DRAIN) ×3 IMPLANT
DRAPE LAPAROSCOPIC ABDOMINAL (DRAPES) ×3 IMPLANT
DRAPE UTILITY XL STRL (DRAPES) ×6 IMPLANT
DRSG VAC ATS LRG SENSATRAC (GAUZE/BANDAGES/DRESSINGS) IMPLANT
DRSG VERSA FOAM LRG 10X15 (GAUZE/BANDAGES/DRESSINGS) IMPLANT
ELECT REM PT RETURN 9FT ADLT (ELECTROSURGICAL) ×3
ELECTRODE REM PT RTRN 9FT ADLT (ELECTROSURGICAL) ×1 IMPLANT
EVACUATOR SILICONE 100CC (DRAIN) ×3 IMPLANT
GLOVE BIOGEL PI IND STRL 8 (GLOVE) ×1 IMPLANT
GLOVE BIOGEL PI INDICATOR 8 (GLOVE) ×2
GLOVE ECLIPSE 7.5 STRL STRAW (GLOVE) ×3 IMPLANT
GOWN STRL REUS W/ TWL LRG LVL3 (GOWN DISPOSABLE) ×2 IMPLANT
GOWN STRL REUS W/TWL LRG LVL3 (GOWN DISPOSABLE) ×4
HANDLE SUCTION POOLE (INSTRUMENTS) ×1 IMPLANT
KIT BASIN OR (CUSTOM PROCEDURE TRAY) ×3 IMPLANT
KIT ROOM TURNOVER OR (KITS) ×3 IMPLANT
NS IRRIG 1000ML POUR BTL (IV SOLUTION) ×3 IMPLANT
PACK GENERAL/GYN (CUSTOM PROCEDURE TRAY) ×3 IMPLANT
PAD ARMBOARD 7.5X6 YLW CONV (MISCELLANEOUS) ×6 IMPLANT
SPONGE ABDOMINAL VAC ABTHERA (MISCELLANEOUS) ×3 IMPLANT
SUCTION POOLE HANDLE (INSTRUMENTS) ×3
SUCTION POOLE TIP (SUCTIONS) IMPLANT
TOWEL OR 17X24 6PK STRL BLUE (TOWEL DISPOSABLE) ×3 IMPLANT
TOWEL OR 17X26 10 PK STRL BLUE (TOWEL DISPOSABLE) ×3 IMPLANT
WATER STERILE IRR 1000ML POUR (IV SOLUTION) IMPLANT

## 2016-04-13 NOTE — Procedures (Signed)
[facility]link notified about increased PIP and plateau pressures.  STAT chest x-ray ordered.
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
## 2016-04-13 NOTE — Procedures (Signed)
Intubation Procedure Note Nathen Maymmitt XXXWatkins [id number] Jul 02, 1981  Procedure: Intubation Indications: Respiratory insufficiency  Procedure Details Consent: Unable to obtain consent because of emergent medical necessity. Time Out: Verified patient identification, verified procedure, site/side was marked, verified correct patient position, special equipment/implants available, medications/allergies/relevent history reviewed, required imaging and test results available.  Performed  Maximum sterile technique was used including gloves, hand hygiene and mask.  Intubated over a bronchoscope    Evaluation Hemodynamic Status: BP stable throughout; O2 sats: stable throughout Patient's Current Condition: stable Complications: No apparent complications Patient did tolerate procedure well. Chest X-ray ordered to verify placement.  CXR: verified bronchoscopically.   Koren BoundYACOUB,Ediel Unangst 04/13/2016
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
## 2016-04-13 NOTE — Transfer of Care (Signed)
Immediate Anesthesia Transfer of Care Note  Patient: Bill [name]  Procedure(s) Performed: Procedure(s): IRRIGATION AND DEBRIDEMENT OPEN ABDOMEN, REPLACEMENT OF ABDOMINAL VAC SPONGE (N/A)  Patient Location: ICU  Anesthesia Type:General  Level of Consciousness: sedated, unresponsive and Patient remains intubated per anesthesia plan  Airway & Oxygen Therapy: Patient remains intubated per anesthesia plan  Post-op Assessment: Report given to RN  Post vital signs: Reviewed and stable  Last Vitals:  Vitals:   04/13/16 1010 04/13/16 1017  BP: 122/69 119/72  Pulse: (!) 103 (!) 104  Resp: (!) 30 (!) 30  Temp: 37.7 C 37.4 C    Last Pain:  Vitals:   04/13/16 1017  TempSrc: Axillary  PainSc:          Complications: No apparent anesthesia complications

## 2016-04-13 NOTE — Procedures (Signed)
Bronchoscopy Procedure Note Bill [name] Bill [name] [id number] 09/22/1980  Procedure: Bronchoscopy Indications: Diagnostic evaluation of the airways, Obtain specimens for culture and/or other diagnostic studies and Remove secretions  Procedure Details Consent: Risks of procedure as well as the alternatives and risks of each were explained to the (patient/caregiver).  Consent for procedure obtained. and Unable to obtain consent because of emergent medical necessity. Time Out: Verified patient identification, verified procedure, site/side was marked, verified correct patient position, special equipment/implants available, medications/allergies/relevent history reviewed, required imaging and test results available.  Performed  In preparation for procedure, patient was given 100% FiO2 and bronchoscope lubricated. Sedation: Benzodiazepines and Propofol  Airway entered and the following bronchi were examined: RUL, RML, RLL, LUL, LLL and Bronchi.   ETT above cords. Complete obstruction of the left main stem bronchus was secretion that was removed (appeared bloody>purulent).  Left main stem opened and right main stem cleaned. Bronchoscope removed.    Evaluation Hemodynamic Status: BP stable throughout; O2 sats: stable throughout Patient's Current Condition: stable Specimens:  Sent bloody>purulent fluid Complications: No apparent complications Patient did tolerate procedure well.   Bill [name],Bill [name] 04/13/2016

## 2016-04-13 NOTE — Progress Notes (Signed)
Attempted to call family x 3 attempts to notify/consent for washout today- no answer at mother Fredna Dow([name] Hocutt [phone]) and message left for wife Anna Genre(Letreile McCellon, estranged, [phone])

## 2016-04-13 NOTE — Op Note (Addendum)
Operative Note  Bill [name] 35 y.o. male [id number]  04/13/2016  Surgeon: Berna Buehelsea A Connor   Assistant: Hosie SpangleElizabeth Simaan  Procedure performed: Re-exploration of open abdomen, removal of 3 laparotomy pads, placement of JP drain, replacement of AbThera wound vac  Preop diagnosis: Open abdomen, retained laparotomy pads, penetrating injury to the dome of the liver  Post-op diagnosis/intraop findings: same  Specimens: None  EBL: minimal  Complications: none  Description of procedure: After obtaining informed consent from the patient's mother, the patient was brought to the operating room and placed supine on operating room table. Antibiotics were infusing and a formal timeout was performed. The previously placed abthera was removed and the abdomen was prepped and draped in usual sterile fashion. The abdomen was inspected, and the 3 previously documented laparotomy pads were removed from over the dome and posterior aspect of the liver. There was a small amount of bile staining on 2 of the 3 laparotomy pads, and some residual liquid hematoma was evacuated from the right upper quadrant with a small amount of ongoing oozing from the liver injuries. The right upper quadrant was copiously irrigated until the effluent was clear. Given the bile staining I elected to introduce a 3919 [place] round Blake drain over the dome of the liver to sit next to the liver injuries and exited in the right hemiabdomen. This was sutured in place with a 2-0 nylon. The rest of the abdomen was inspected. The stomach was decompressed with an NG tube in place. There was no apparent retroperitoneal hematoma or periduodenal hematoma to suggest injury. The small bowel was run from the ligament of Treitz to the cecum and it was healthy-appearing, decompressed, and without really any edema. The descending colon, transverse colon, sigmoid and rectum were inspected, and also were healthy-appearing, uninjured and without significant  edema. There was no blood products in the left upper quadrant, paracolic gutters, or pelvis. Really the only residual hematoma was limited to above the dome of the liver, although there was some blood staining on the mesentery on the underside of the liver. Due to the patient's ongoing significant pulmonary dysfunction and pressor requirement, I elected not to close his abdomen although I think it would have closed very easily given the relative lack of third spacing and bowel edema that I found. We'll plan to continue supportive care and hopefully improve his ventilatory status over the next 24-48 hours, at which time he should return to the OR for abdominal closure.   All counts were correct at the completion of the case.

## 2016-04-14 ENCOUNTER — Inpatient Hospital Stay (HOSPITAL_COMMUNITY): Payer: Self-pay

## 2016-04-14 ENCOUNTER — Encounter (HOSPITAL_COMMUNITY): Payer: Self-pay | Admitting: Radiology

## 2016-04-14 DIAGNOSIS — G9340 Encephalopathy, unspecified: Secondary | ICD-10-CM

## 2016-04-14 LAB — TYPE AND SCREEN
ABO/RH(D): A POS
ANTIBODY SCREEN: NEGATIVE
UNIT DIVISION: 0
UNIT DIVISION: 0
UNIT DIVISION: 0
UNIT DIVISION: 0
UNIT DIVISION: 0
UNIT DIVISION: 0
UNIT DIVISION: 0
UNIT DIVISION: 0
UNIT DIVISION: 0
UNIT DIVISION: 0
UNIT DIVISION: 0
UNIT DIVISION: 0
UNIT DIVISION: 0
UNIT DIVISION: 0
UNIT DIVISION: 0
Unit division: 0
Unit division: 0
Unit division: 0
Unit division: 0
Unit division: 0
Unit division: 0
Unit division: 0
Unit division: 0
Unit division: 0
Unit division: 0
Unit division: 0
Unit division: 0
Unit division: 0
Unit division: 0
Unit division: 0

## 2016-04-14 LAB — CBC WITH DIFFERENTIAL/PLATELET
BASOS PCT: 0 %
Basophils Absolute: 0 10*3/uL (ref 0.0–0.1)
EOS ABS: 0.3 10*3/uL (ref 0.0–0.7)
EOS PCT: 2 %
HCT: 30.4 % — ABNORMAL LOW (ref 39.0–52.0)
Hemoglobin: 9.7 g/dL — ABNORMAL LOW (ref 13.0–17.0)
LYMPHS ABS: 1.7 10*3/uL (ref 0.7–4.0)
Lymphocytes Relative: 10 %
MCH: 28.4 pg (ref 26.0–34.0)
MCHC: 31.9 g/dL (ref 30.0–36.0)
MCV: 89.1 fL (ref 78.0–100.0)
MONO ABS: 1.5 10*3/uL — AB (ref 0.1–1.0)
Monocytes Relative: 9 %
NEUTROS ABS: 13.1 10*3/uL — AB (ref 1.7–7.7)
Neutrophils Relative %: 79 %
PLATELETS: 91 10*3/uL — AB (ref 150–400)
RBC: 3.41 MIL/uL — ABNORMAL LOW (ref 4.22–5.81)
RDW: 14.5 % (ref 11.5–15.5)
WBC MORPHOLOGY: INCREASED
WBC: 16.6 10*3/uL — ABNORMAL HIGH (ref 4.0–10.5)

## 2016-04-14 LAB — RENAL FUNCTION PANEL
ALBUMIN: 1.9 g/dL — AB (ref 3.5–5.0)
ANION GAP: 5 (ref 5–15)
BUN: 7 mg/dL (ref 6–20)
CALCIUM: 7.6 mg/dL — AB (ref 8.9–10.3)
CO2: 28 mmol/L (ref 22–32)
CREATININE: 1.15 mg/dL (ref 0.61–1.24)
Chloride: 110 mmol/L (ref 101–111)
GFR calc Af Amer: >60 mL/min (ref >60)
GFR calc non Af Amer: >60 mL/min (ref >60)
GLUCOSE: 149 mg/dL — AB (ref 65–99)
PHOSPHORUS: 1.3 mg/dL — AB (ref 2.5–4.6)
Potassium: 3.7 mmol/L (ref 3.5–5.1)
SODIUM: 143 mmol/L (ref 135–145)

## 2016-04-14 LAB — PREPARE RBC (CROSSMATCH)

## 2016-04-14 LAB — PREPARE PLATELET PHERESIS: UNIT DIVISION: 0

## 2016-04-14 LAB — GLUCOSE, CAPILLARY
GLUCOSE-CAPILLARY: 117 mg/dL — AB (ref 65–99)
GLUCOSE-CAPILLARY: 138 mg/dL — AB (ref 65–99)
GLUCOSE-CAPILLARY: 81 mg/dL (ref 65–99)
Glucose-Capillary: 92 mg/dL (ref 65–99)
Glucose-Capillary: 123 mg/dL — ABNORMAL HIGH (ref 65–99)
Glucose-Capillary: 138 mg/dL — ABNORMAL HIGH (ref 65–99)

## 2016-04-14 LAB — TRIGLYCERIDES: TRIGLYCERIDES: 271 mg/dL — AB (ref <150)

## 2016-04-14 LAB — PROTIME-INR
INR: 1.41
PROTHROMBIN TIME: 17.4 s — AB (ref 11.4–15.2)

## 2016-04-14 LAB — SODIUM
SODIUM: 142 mmol/L (ref 135–145)
SODIUM: 146 mmol/L — AB (ref 135–145)

## 2016-04-14 LAB — AMMONIA: Ammonia: 35 umol/L (ref 9–35)

## 2016-04-14 LAB — MAGNESIUM: Magnesium: 1.7 mg/dL (ref 1.7–2.4)

## 2016-04-14 MED ORDER — POTASSIUM PHOSPHATES 15 MMOLE/5ML IV SOLN
10.0000 mmol | Freq: Once | INTRAVENOUS | Status: AC
  Administered 2016-04-14: 10 mmol via INTRAVENOUS
  Filled 2016-04-14: qty 3.33

## 2016-04-14 MED ORDER — SODIUM CHLORIDE 3 % IV SOLN
INTRAVENOUS | Status: DC
  Administered 2016-04-14 – 2016-04-15 (×3): 75 mL/h via INTRAVENOUS
  Administered 2016-04-15 – 2016-04-16 (×4): 100 mL/h via INTRAVENOUS
  Filled 2016-04-14 (×25): qty 500

## 2016-04-14 MED ORDER — VANCOMYCIN HCL IN DEXTROSE 1-5 GM/200ML-% IV SOLN
1000.0000 mg | Freq: Three times a day (TID) | INTRAVENOUS | Status: DC
  Administered 2016-04-14 – 2016-04-15 (×4): 1000 mg via INTRAVENOUS
  Filled 2016-04-14 (×5): qty 200

## 2016-04-14 MED ORDER — SODIUM CHLORIDE 0.9 % IV SOLN
Freq: Once | INTRAVENOUS | Status: AC
  Administered 2016-04-14: 12:00:00 via INTRAVENOUS

## 2016-04-14 MED ORDER — MAGNESIUM SULFATE 2 GM/50ML IV SOLN
2.0000 g | Freq: Once | INTRAVENOUS | Status: AC
  Administered 2016-04-14: 2 g via INTRAVENOUS
  Filled 2016-04-14: qty 50

## 2016-04-14 MED ORDER — SODIUM CHLORIDE 0.9 % IV SOLN: Freq: Once | INTRAVENOUS | Status: AC

## 2016-04-14 MED ORDER — CHLORHEXIDINE GLUCONATE 0.12% ORAL RINSE (MEDLINE KIT)
15.0000 mL | Freq: Two times a day (BID) | OROMUCOSAL | Status: DC
  Administered 2016-04-14 – 2016-05-01 (×33): 15 mL via OROMUCOSAL

## 2016-04-14 MED ORDER — ORAL CARE MOUTH RINSE
15.0000 mL | OROMUCOSAL | Status: DC
  Administered 2016-04-14 – 2016-04-29 (×147): 15 mL via OROMUCOSAL

## 2016-04-14 MED ORDER — IOPAMIDOL (ISOVUE-300) INJECTION 61%
INTRAVENOUS | Status: AC
  Administered 2016-04-14: 50 mL
  Filled 2016-04-14: qty 50

## 2016-04-14 NOTE — Progress Notes (Signed)
Trauma and Critical Care  Patient Details:    Bill [name] is an 35 y.o. male.  Lines/tubes : Airway 7.5 mm (Active)  Secured at (cm) 22 cm 04/12/2016  3:25 AM  Measured From Lips 04/12/2016  3:25 AM  Secured Location Right 04/12/2016  3:25 AM  Secured By [employer] 04/12/2016  3:25 AM  Tube Holder Repositioned Yes 04/12/2016  3:25 AM  Cuff Pressure (cm H2O) 30 cm H2O 04/11/2016  7:25 PM  Site Condition Drainage (Comment) 04/12/2016  3:25 AM     CVC Triple Lumen 04/10/16 Left Subclavian (Active)  Indication for Insertion or Continuance of Line Vasoactive infusions 04/12/2016  7:46 AM  Site Assessment Clean;Dry;Intact 04/11/2016  8:00 PM  Proximal Lumen Status Infusing 04/11/2016  8:00 PM  Medial Flushed;Saline locked 04/11/2016  8:00 PM  Distal Lumen Status Flushed;Saline locked 04/11/2016  8:00 PM  Dressing Type Transparent;Occlusive 04/11/2016  8:00 PM  Dressing Status Clean;Dry;Intact 04/11/2016  8:00 PM  Dressing Intervention New dressing 04/10/2016  9:51 PM  Dressing Change Due 04/17/16 04/11/2016  8:00 PM     Arterial Line 04/10/16 Right Radial (Active)  Site Assessment Clean;Dry;Intact 04/11/2016  8:00 PM  Line Status Pulsatile blood flow 04/11/2016  8:00 PM  Art Line Waveform Appropriate 04/11/2016  8:00 PM  Art Line Interventions Zeroed and calibrated;Leveled;Connections checked and tightened;Flushed per protocol 04/11/2016  8:00 AM  Color/Movement/Sensation Capillary refill less than 3 sec 04/11/2016  8:00 AM  Dressing Type Transparent;Occlusive 04/11/2016  8:00 PM  Dressing Status Clean;Dry;Intact;Antimicrobial disc in place 04/11/2016  8:00 PM  Dressing Change Due 04/17/16 04/11/2016  8:00 PM     Chest Tube 2 Right Pleural (Active)  Suction -20 cm H2O 04/11/2016  8:00 PM  Chest Tube Air Leak None 04/11/2016  8:00 PM  Patency Intervention Milked 04/11/2016  8:00 PM  Drainage Description Bright red 04/11/2016  8:00 PM  Dressing Status Clean;Dry;Intact 04/11/2016  8:00 PM   Dressing Intervention New dressing 04/10/2016 10:08 PM  Site Assessment Other (Comment) 04/11/2016  8:00 PM  Surrounding Skin Unable to view 04/11/2016  8:00 PM  Output (mL) 150 mL 04/12/2016  6:00 AM     Chest Tube 1 Left Pleural 32 Fr. (Active)  Suction -20 cm H2O 04/11/2016  8:00 PM  Chest Tube Air Leak None 04/11/2016  8:00 PM  Patency Intervention Milked 04/11/2016  8:00 PM  Dressing Status Clean;Dry;Intact 04/11/2016  8:00 PM  Site Assessment Clean 04/11/2016  8:00 PM  Surrounding Skin Unable to view 04/11/2016  8:00 PM  Output (mL) 0 mL 04/11/2016  6:00 AM     Chest Tube 1 (Active)  Suction -20 cm H2O 04/11/2016  8:00 AM  Patency Intervention Tip/tilt 04/11/2016  8:00 AM  Drainage Description Sanguineous 04/11/2016  8:00 AM  Dressing Status Clean;Dry;Intact 04/11/2016  8:00 AM  Site Assessment Other (Comment) 04/11/2016  8:00 AM  Surrounding Skin Unable to view 04/11/2016  8:00 AM  Output (mL) 56 mL 04/12/2016  6:00 AM     Chest Tube 3 Left Pleural (Active)  Suction -20 cm H2O 04/11/2016  8:00 PM  Chest Tube Air Leak Minimal 04/11/2016  8:00 PM  Patency Intervention Milked 04/11/2016  8:00 PM  Drainage Description Sanguineous 04/11/2016  8:00 PM  Dressing Status Clean;Dry;Intact 04/11/2016  8:00 PM  Dressing Intervention New dressing 04/11/2016 10:00 AM  Site Assessment Intact 04/11/2016  8:00 PM  Surrounding Skin Dry;Intact 04/11/2016  8:00 PM  Output (mL) 8 mL 04/12/2016  6:00 AM  Negative Pressure Wound Therapy Abdomen Anterior;Medial (Active)  Last dressing change 04/10/16 04/11/2016  8:00 PM  Site / Wound Assessment Dressing in place / Unable to assess 04/11/2016  8:00 PM  Peri-wound Assessment Intact 04/11/2016  8:00 PM  Cycle Continuous 04/11/2016  8:00 PM  Target Pressure (mmHg) 125 04/11/2016  8:00 PM  Canister Changed Yes 04/11/2016 10:00 AM  Dressing Status Intact 04/11/2016  8:00 PM  Drainage Amount Moderate 04/11/2016  8:00 PM  Drainage Description Sanguineous 04/11/2016  8:00 PM   Output (mL) 250 mL 04/12/2016  6:00 AM     NG/OG Tube Orogastric (Active)  Site Assessment Clean;Dry;Intact 04/11/2016  8:00 PM  Ongoing Placement Verification Auscultation 04/11/2016  8:00 PM  Status Suction-low intermittent 04/11/2016  8:00 PM  Drainage Appearance Brown;Green 04/11/2016  8:00 PM  Output (mL) 100 mL 04/12/2016  6:00 AM     Urethral Catheter C.[place]elver[place]-RN Latex 16 Fr. (Active)  Indication for Insertion or Continuance of Catheter Unstable critical patients (first 24-48 hours) 04/12/2016  7:46 AM  Site Assessment Clean;Intact 04/11/2016  8:00 PM  Catheter Maintenance Bag below level of bladder;Catheter secured;Drainage bag/tubing not touching floor;Insertion date on drainage bag;No dependent loops;Seal intact 04/12/2016  7:46 AM  Collection Container Standard drainage bag 04/11/2016  8:00 PM  Securement Method Securing device (Describe) 04/11/2016  8:00 PM  Urinary Catheter Interventions Unclamped 04/11/2016  1:00 AM  Output (mL) 35 mL 04/12/2016  6:00 AM    Microbiology/Sepsis markers: Results for orders placed or performed during the hospital encounter of 04/10/16  MRSA PCR Screening     Status: None   Collection Time: 04/11/16 12:36 AM  Result Value Ref Range Status   MRSA by PCR NEGATIVE NEGATIVE Final    Comment:        The GeneXpert MRSA Assay (FDA approved for NASAL specimens only), is one component of a comprehensive MRSA colonization surveillance program. It is not intended to diagnose MRSA infection nor to guide or monitor treatment for MRSA infections.   Culture, bal-quantitative     Status: None (Preliminary result)   Collection Time: 04/13/16  2:51 AM  Result Value Ref Range Status   Specimen Description BRONCHIAL ALVEOLAR LAVAGE  Final   Special Requests Normal  Final   Gram Stain   Final    FEW WBC PRESENT,BOTH PMN AND MONONUCLEAR RARE GRAM POSITIVE COCCI IN PAIRS    Culture TOO YOUNG TO READ  Final   Report Status PENDING  Incomplete     Anti-infectives:  Anti-infectives    Start     Dose/Rate Route Frequency Ordered Stop   04/14/16 1100  vancomycin (VANCOCIN) IVPB 1000 mg/200 mL premix     1,000 mg 200 mL/hr over 60 Minutes Intravenous Every 8 hours 04/14/16 1019     04/13/16 2300  vancomycin (VANCOCIN) IVPB 750 mg/150 ml premix  Status:  Discontinued     750 mg 150 mL/hr over 60 Minutes Intravenous Every 12 hours 04/13/16 0953 04/14/16 1018   04/13/16 1030  vancomycin (VANCOCIN) 2,000 mg in sodium chloride 0.9 % 500 mL IVPB     2,000 mg 250 mL/hr over 120 Minutes Intravenous  Once 04/13/16 0953 04/13/16 1207   04/10/16 2230  cefoTEtan (CEFOTAN) 1 g in dextrose 5 % 50 mL IVPB     1 g 100 mL/hr over 30 Minutes Intravenous  Once 04/10/16 2220 04/10/16 2321   04/10/16 2150  ceFAZolin (ANCEF) 2-4 GM/100ML-% IVPB    Comments:  Delle Reining   : cabinet override  04/10/16 2150 04/10/16 2221      Best Practice/Protocols:  VTE Prophylaxis: Mechanical GI Prophylaxis: Proton Pump Inhibitor Continous Sedation and also on paralytic agent  Consults: Treatment Team:  Alleen Borne, MD Md Pccm, MD    Events:  Subjective:    Overnight Issues: Had 30 minutes off sedation and was not responsive other than increased heart rate.  Off pressors. Went to OR yesterday.    Objective:  Vital signs for last 24 hours: Temp:  [97.7 F (36.5 C)-100.3 F (37.9 C)] 100.3 F (37.9 C) (09/23 0800) Pulse Rate:  [92-120] 95 (09/23 0845) Resp:  [12-31] 30 (09/23 0845) BP: (55-138)/(40-83) 119/73 (09/23 0830) SpO2:  [93 %-100 %] 98 % (09/23 0845) FiO2 (%):  [50 %-60 %] 60 % (09/23 0745)  Hemodynamic parameters for last 24 hours: CVP:  [13 mmHg-23 mmHg] 15 mmHg  Intake/Output from previous day: 09/22 0701 - 09/23 0700 In: 5947.1 [I.V.:5357.5; Blood:349.6; IV Piggyback:150] Out: 3558 [Urine:1800; Emesis/NG output:350; Drains:1150; Blood:50; Chest Tube:208]  Intake/Output this shift: Total I/O In: 179.1  [I.V.:179.1] Out: 190 [Urine:120; Drains:70]  Vent settings for last 24 hours: Vent Mode: PRVC FiO2 (%):  [50 %-60 %] 60 % Set Rate:  [30 bmp] 30 bmp Vt Set:  [440 mL] 440 mL PEEP:  [10 cmH20] 10 cmH20 Plateau Pressure:  [29 cmH20-33 cmH20] 32 cmH20  Physical Exam:  General: no respiratory distress, sedated Neuro: RASS -3 or deeper.  Not responding.   Resp: coarse BS bilaterally CVS: regular rate and rhythm GI: hypoactive BS and open abdomen with wound vac in place.  Drain on right with bloody/bilious drainage.   Extremities: no edema, no erythema, pulses WNL  Results for orders placed or performed during the hospital encounter of 04/10/16 (from the past 24 hour(s))  Glucose, capillary     Status: Abnormal   Collection Time: 04/13/16 12:07 PM  Result Value Ref Range   Glucose-Capillary 123 (H) 65 - 99 mg/dL  Glucose, capillary     Status: Abnormal   Collection Time: 04/13/16  3:05 PM  Result Value Ref Range   Glucose-Capillary 126 (H) 65 - 99 mg/dL  Glucose, capillary     Status: Abnormal   Collection Time: 04/13/16  7:32 PM  Result Value Ref Range   Glucose-Capillary 121 (H) 65 - 99 mg/dL  Glucose, capillary     Status: None   Collection Time: 04/14/16 12:14 AM  Result Value Ref Range   Glucose-Capillary 81 65 - 99 mg/dL  Glucose, capillary     Status: None   Collection Time: 04/14/16  4:25 AM  Result Value Ref Range   Glucose-Capillary 92 65 - 99 mg/dL  Triglycerides     Status: Abnormal   Collection Time: 04/14/16  5:00 AM  Result Value Ref Range   Triglycerides 271 (H) <150 mg/dL  Renal function panel     Status: Abnormal   Collection Time: 04/14/16  5:00 AM  Result Value Ref Range   Sodium 143 135 - 145 mmol/L   Potassium 3.7 3.5 - 5.1 mmol/L   Chloride 110 101 - 111 mmol/L   CO2 28 22 - 32 mmol/L   Glucose, Bld 149 (H) 65 - 99 mg/dL   BUN 7 6 - 20 mg/dL   Creatinine, Ser 1.61 0.61 - 1.24 mg/dL   Calcium 7.6 (L) 8.9 - 10.3 mg/dL   Phosphorus 1.3 (L) 2.5 -  4.6 mg/dL   Albumin 1.9 (L) 3.5 - 5.0 g/dL   GFR calc non  Af Amer >60 >60 mL/min   GFR calc Af Amer >60 >60 mL/min   Anion gap 5 5 - 15  Magnesium     Status: None   Collection Time: 04/14/16  5:00 AM  Result Value Ref Range   Magnesium 1.7 1.7 - 2.4 mg/dL  CBC with Differential/Platelet     Status: Abnormal   Collection Time: 04/14/16  5:00 AM  Result Value Ref Range   WBC 16.6 (H) 4.0 - 10.5 K/uL   RBC 3.41 (L) 4.22 - 5.81 MIL/uL   Hemoglobin 9.7 (L) 13.0 - 17.0 g/dL   HCT 16.130.4 (L) 09.639.0 - 04.552.0 %   MCV 89.1 78.0 - 100.0 fL   MCH 28.4 26.0 - 34.0 pg   MCHC 31.9 30.0 - 36.0 g/dL   RDW 40.914.5 81.111.5 - 91.415.5 %   Platelets 91 (L) 150 - 400 K/uL   Neutrophils Relative % 79 %   Lymphocytes Relative 10 %   Monocytes Relative 9 %   Eosinophils Relative 2 %   Basophils Relative 0 %   Neutro Abs 13.1 (H) 1.7 - 7.7 K/uL   Lymphs Abs 1.7 0.7 - 4.0 K/uL   Monocytes Absolute 1.5 (H) 0.1 - 1.0 K/uL   Eosinophils Absolute 0.3 0.0 - 0.7 K/uL   Basophils Absolute 0.0 0.0 - 0.1 K/uL   WBC Morphology INCREASED BANDS (>20% BANDS)   Glucose, capillary     Status: Abnormal   Collection Time: 04/14/16  8:21 AM  Result Value Ref Range   Glucose-Capillary 123 (H) 65 - 99 mg/dL  Prepare RBC     Status: None   Collection Time: 04/14/16 10:00 AM  Result Value Ref Range   Order Confirmation ORDER PROCESSED BY BLOOD BANK      Assessment/Plan:   NEURO  Altered Mental Status:     Plan: Head CT since no activity on wake up assessment.  Concern for diffuse cerebral edema on CT.  Neuro consulting.  No hyponatremia.  Ammonia pending.  No evidence of head injury.  No code events or dramatic hypotension, but has been on and off pressors.  PULM  Atelectasis/collapse (focal and bibasilar)   Plan: Bibasilar atelectasis continues.  Right side is worse.    CARDIO  Sinus Tachycardia   Plan: Sinus tachycardia improved.  RENAL  Oliguria (low effective intravascular volume)   Plan: resolved.  Creatinine improved.   GI  Hepatic Trauma, Penetrating Abdominal Trauma and open abdomen with packing.   Plan: Plan OR tomorrow for takeback and possible closure.  ID  No known infectious sources   Plan: low grade temps, leukocytosis improving.  On vanc.  HEME  Acute blood loss anemia with some dilutional anemia.  Thrombocytopenia.     Plan: .  No transfusion at this point.  Watch for signs of continued decrease.  Will make sure active type and cross available for tomorrow.   Platelet count improved a bit.  Currently holding on pharmacologic DVT prophylaxis due to the low platelets.  Hope to close abd tomorrow and can possibly start after that.    ENDO Mild stress induced hyperglycemia.     Plan: CPM  [facility]  OR tomorrow.   Neuro consult.     LOS: 4 days   Additional follow up:  cbc/bmet comments:I reviewed the patient's new clinical lab test results. CXR is pending. and New labs of lactic acid level and PT/INR are pending  Critical Care Total Time*: 30 Minutes  Valarie Farace 04/14/2016  *Care during  the described time interval was provided by me and/or other providers on the critical care team.  I have reviewed this patient's available data, including medical history, events of note, physical examination and test results as part of my evaluation.

## 2016-04-14 NOTE — Progress Notes (Signed)
RT Note: pt transported to CT at this time and back to 3M11 on vent, no complications.
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
## 2016-04-14 NOTE — Procedures (Signed)
History: 35 year old male with likely anoxic brain injury  Sedation: Propofol  Technique: This is a 21 channel routine scalp EEG performed at the bedside with bipolar and monopolar montages arranged in accordance to the international 10/20 system of electrode placement. One channel was dedicated to EKG recording.    Background: The background consists of generalized irregular slow activity. There are frequent bifrontally predominant discharges with triphasic morphology. These become slightly more prominent following noxious stimulation. He has an episode of "shivering" during the EEG that was typical for an episode of concern which did not have any clear change to the background EEG accompanying it. No PDR was observed.  Photic stimulation: Physiologic driving is now performed  EEG Abnormalities: 1) triphasic waves 2) generalized irregular slow activity  Clinical Interpretation: This EEG is consistent with a moderate to severe generalized nonspecific cerebral dysfunction (encephalopathy). There was no seizure or seizure predisposition recorded on this study. Please note that a normal EEG does not preclude the possibility of epilepsy.   Ritta SlotMcNeill Naisha Wisdom, MD [facility] [phone]  If 7pm- 7am, please page neurology on call as listed in AMION.

## 2016-04-14 NOTE — Progress Notes (Addendum)
Upon initial assessment, pt still not responding to painful stimulation, despite titration in sedation/pain medications.  Pupils still briskly reactive and equal, but pt has scleral edema bilaterally today. Dr. Donell BeersByerly notified.  Head CT with/without contrast ordered.
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
## 2016-04-14 NOTE — Progress Notes (Signed)
STAT EEG completed; results pending. Dr Kirkpatrick notified. 
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
## 2016-04-14 NOTE — Progress Notes (Signed)
Dr. Donell BeersByerly notified of head CT results.

## 2016-04-15 ENCOUNTER — Encounter (HOSPITAL_COMMUNITY): Admission: EM | Disposition: A | Discharge status: Rehabilitation Facility | Payer: Self-pay | Source: Home / Self Care

## 2016-04-15 ENCOUNTER — Inpatient Hospital Stay (HOSPITAL_COMMUNITY): Payer: Self-pay | Admitting: Anesthesiology

## 2016-04-15 ENCOUNTER — Inpatient Hospital Stay (HOSPITAL_COMMUNITY): Payer: Self-pay

## 2016-04-15 HISTORY — PX: LAPAROTOMY: SHX154

## 2016-04-15 HISTORY — PX: WOUND DEBRIDEMENT: SHX247

## 2016-04-15 LAB — TYPE AND SCREEN
ABO/RH(D): A POS
Antibody Screen: NEGATIVE
UNIT DIVISION: 0
UNIT DIVISION: 0

## 2016-04-15 LAB — GLUCOSE, CAPILLARY
GLUCOSE-CAPILLARY: 116 mg/dL — AB (ref 65–99)
GLUCOSE-CAPILLARY: 127 mg/dL — AB (ref 65–99)
GLUCOSE-CAPILLARY: 131 mg/dL — AB (ref 65–99)
GLUCOSE-CAPILLARY: 96 mg/dL (ref 65–99)
Glucose-Capillary: 100 mg/dL — ABNORMAL HIGH (ref 65–99)
Glucose-Capillary: 109 mg/dL — ABNORMAL HIGH (ref 65–99)
Glucose-Capillary: 132 mg/dL — ABNORMAL HIGH (ref 65–99)

## 2016-04-15 LAB — SODIUM
SODIUM: 146 mmol/L — AB (ref 135–145)
SODIUM: 151 mmol/L — AB (ref 135–145)
Sodium: 148 mmol/L — ABNORMAL HIGH (ref 135–145)
Sodium: 151 mmol/L — ABNORMAL HIGH (ref 135–145)

## 2016-04-15 LAB — CULTURE, BAL-QUANTITATIVE W GRAM STAIN
Culture: NORMAL
Special Requests: NORMAL

## 2016-04-15 LAB — CBC WITH DIFFERENTIAL/PLATELET
BASOS ABS: 0.1 10*3/uL (ref 0.0–0.1)
BASOS PCT: 0 %
Eosinophils Absolute: 0.3 10*3/uL (ref 0.0–0.7)
Eosinophils Relative: 2 %
HEMATOCRIT: 36.3 % — AB (ref 39.0–52.0)
HEMOGLOBIN: 11.6 g/dL — AB (ref 13.0–17.0)
Lymphocytes Relative: 10 %
Lymphs Abs: 1.5 10*3/uL (ref 0.7–4.0)
MCH: 28.4 pg (ref 26.0–34.0)
MCHC: 32 g/dL (ref 30.0–36.0)
MCV: 89 fL (ref 78.0–100.0)
MONOS PCT: 12 %
Monocytes Absolute: 1.9 10*3/uL — ABNORMAL HIGH (ref 0.1–1.0)
NEUTROS ABS: 12.4 10*3/uL — AB (ref 1.7–7.7)
NEUTROS PCT: 77 %
Platelets: 91 10*3/uL — ABNORMAL LOW (ref 150–400)
RBC: 4.08 MIL/uL — ABNORMAL LOW (ref 4.22–5.81)
RDW: 15.3 % (ref 11.5–15.5)
WBC: 16.1 10*3/uL — ABNORMAL HIGH (ref 4.0–10.5)

## 2016-04-15 LAB — HEMOGLOBIN A1C
HEMOGLOBIN A1C: 5.7 % — AB (ref 4.8–5.6)
Mean Plasma Glucose: 117 mg/dL

## 2016-04-15 LAB — RENAL FUNCTION PANEL
ANION GAP: 4 — AB (ref 5–15)
Albumin: 1.9 g/dL — ABNORMAL LOW (ref 3.5–5.0)
BUN: 7 mg/dL (ref 6–20)
CHLORIDE: 116 mmol/L — AB (ref 101–111)
CO2: 27 mmol/L (ref 22–32)
Calcium: 7.4 mg/dL — ABNORMAL LOW (ref 8.9–10.3)
Creatinine, Ser: 1.02 mg/dL (ref 0.61–1.24)
GFR calc Af Amer: >60 mL/min (ref >60)
GFR calc non Af Amer: >60 mL/min (ref >60)
GLUCOSE: 127 mg/dL — AB (ref 65–99)
PHOSPHORUS: 1.3 mg/dL — AB (ref 2.5–4.6)
POTASSIUM: 3.7 mmol/L (ref 3.5–5.1)
Sodium: 147 mmol/L — ABNORMAL HIGH (ref 135–145)

## 2016-04-15 LAB — CULTURE, BAL-QUANTITATIVE

## 2016-04-15 LAB — TRIGLYCERIDES: Triglycerides: 274 mg/dL — ABNORMAL HIGH (ref <150)

## 2016-04-15 LAB — MAGNESIUM: MAGNESIUM: 2.2 mg/dL (ref 1.7–2.4)

## 2016-04-15 SURGERY — LAPAROTOMY, EXPLORATORY
Anesthesia: General | Site: Abdomen

## 2016-04-15 MED ORDER — VANCOMYCIN HCL 1000 MG IV SOLR
1000.0000 mg | Freq: Three times a day (TID) | INTRAVENOUS | Status: DC
  Administered 2016-04-15 – 2016-04-16 (×2): 1000 mg via INTRAVENOUS
  Filled 2016-04-15 (×4): qty 1000

## 2016-04-15 MED ORDER — POTASSIUM PHOSPHATES 15 MMOLE/5ML IV SOLN
30.0000 mmol | Freq: Once | INTRAVENOUS | Status: DC
  Filled 2016-04-15: qty 10

## 2016-04-15 MED ORDER — 0.9 % SODIUM CHLORIDE (POUR BTL) OPTIME
TOPICAL | Status: DC | PRN
  Administered 2016-04-15 (×2): 1000 mL

## 2016-04-15 MED ORDER — SODIUM CHLORIDE 0.9 % IV SOLN
0.0000 ug/min | INTRAVENOUS | Status: DC
  Filled 2016-04-15: qty 16

## 2016-04-15 MED ORDER — SODIUM CHLORIDE 0.9 % IV SOLN
INTRAVENOUS | Status: DC | PRN
  Administered 2016-04-15: 09:00:00 via INTRAVENOUS

## 2016-04-15 MED ORDER — VANCOMYCIN HCL 10 G IV SOLR: 1250.0000 mg | Freq: Three times a day (TID) | INTRAVENOUS | Status: DC

## 2016-04-15 MED ORDER — SODIUM CHLORIDE 0.9 % IV SOLN
30.0000 mmol | Freq: Once | INTRAVENOUS | Status: AC
  Administered 2016-04-15: 30 mmol via INTRAVENOUS
  Filled 2016-04-15: qty 10

## 2016-04-15 MED ORDER — ROCURONIUM BROMIDE 10 MG/ML (PF) SYRINGE
PREFILLED_SYRINGE | INTRAVENOUS | Status: DC | PRN
  Administered 2016-04-15: 100 mg via INTRAVENOUS

## 2016-04-15 MED ORDER — ROCURONIUM BROMIDE 10 MG/ML (PF) SYRINGE
PREFILLED_SYRINGE | INTRAVENOUS | Status: AC
  Filled 2016-04-15: qty 10

## 2016-04-15 SURGICAL SUPPLY — 43 items
BLADE SURG ROTATE 9660 (MISCELLANEOUS) IMPLANT
CANISTER SUCTION 2500CC (MISCELLANEOUS) ×4 IMPLANT
COVER SURGICAL LIGHT HANDLE (MISCELLANEOUS) ×4 IMPLANT
DRAIN CHANNEL 19F RND (DRAIN) ×4 IMPLANT
DRAPE LAPAROSCOPIC ABDOMINAL (DRAPES) ×4 IMPLANT
DRAPE WARM FLUID 44X44 (DRAPE) ×4 IMPLANT
DRSG OPSITE POSTOP 4X10 (GAUZE/BANDAGES/DRESSINGS) IMPLANT
DRSG OPSITE POSTOP 4X8 (GAUZE/BANDAGES/DRESSINGS) IMPLANT
ELECT BLADE 6.5 EXT (BLADE) IMPLANT
ELECT CAUTERY BLADE 6.4 (BLADE) ×4 IMPLANT
ELECT REM PT RETURN 9FT ADLT (ELECTROSURGICAL) ×4
ELECTRODE REM PT RTRN 9FT ADLT (ELECTROSURGICAL) ×2 IMPLANT
EVACUATOR SILICONE 100CC (DRAIN) ×4 IMPLANT
GAUZE XEROFORM 5X9 LF (GAUZE/BANDAGES/DRESSINGS) ×8 IMPLANT
GLOVE SURG SIGNA 7.5 PF LTX (GLOVE) ×4 IMPLANT
GOWN STRL REUS W/ TWL LRG LVL3 (GOWN DISPOSABLE) ×2 IMPLANT
GOWN STRL REUS W/ TWL XL LVL3 (GOWN DISPOSABLE) ×2 IMPLANT
GOWN STRL REUS W/TWL LRG LVL3 (GOWN DISPOSABLE) ×2
GOWN STRL REUS W/TWL XL LVL3 (GOWN DISPOSABLE) ×2
KIT BASIN OR (CUSTOM PROCEDURE TRAY) ×4 IMPLANT
KIT ROOM TURNOVER OR (KITS) ×4 IMPLANT
LIGASURE IMPACT 36 18CM CVD LR (INSTRUMENTS) IMPLANT
NS IRRIG 1000ML POUR BTL (IV SOLUTION) ×8 IMPLANT
PACK GENERAL/GYN (CUSTOM PROCEDURE TRAY) ×4 IMPLANT
PAD ABD 8X10 STRL (GAUZE/BANDAGES/DRESSINGS) ×16 IMPLANT
PAD ARMBOARD 7.5X6 YLW CONV (MISCELLANEOUS) ×8 IMPLANT
SPECIMEN JAR LARGE (MISCELLANEOUS) IMPLANT
SPONGE GAUZE 4X4 12PLY STER LF (GAUZE/BANDAGES/DRESSINGS) ×8 IMPLANT
SPONGE LAP 18X18 X RAY DECT (DISPOSABLE) ×4 IMPLANT
STAPLER VISISTAT 35W (STAPLE) IMPLANT
SUCTION POOLE TIP (SUCTIONS) ×4 IMPLANT
SUT PDS AB 1 TP1 96 (SUTURE) ×8 IMPLANT
SUT SILK 2 0 SH CR/8 (SUTURE) IMPLANT
SUT SILK 2 0 TIES 10X30 (SUTURE) IMPLANT
SUT SILK 3 0 SH CR/8 (SUTURE) IMPLANT
SUT SILK 3 0 TIES 10X30 (SUTURE) IMPLANT
SUT VIC AB 3-0 SH 18 (SUTURE) IMPLANT
TAPE CLOTH SURG 6X10 WHT LF (GAUZE/BANDAGES/DRESSINGS) ×4 IMPLANT
TOWEL OR 17X24 6PK STRL BLUE (TOWEL DISPOSABLE) ×4 IMPLANT
TOWEL OR 17X26 10 PK STRL BLUE (TOWEL DISPOSABLE) ×4 IMPLANT
TRAY FOLEY CATH 16FRSI W/METER (SET/KITS/TRAYS/PACK) IMPLANT
TUBING BULK SUCTION (MISCELLANEOUS) ×12 IMPLANT
YANKAUER SUCT BULB TIP NO VENT (SUCTIONS) IMPLANT

## 2016-04-15 NOTE — Progress Notes (Signed)
RT Note: pt transported to CT and back to 3M11 on vent, no complications.
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
## 2016-04-15 NOTE — Transfer of Care (Signed)
Immediate Anesthesia Transfer of Care Note  Patient: Braxten XXXWatkins  Procedure(s) Performed: Procedure(s): WASHOUT WITH POSSIBLE ABDOMINAL CLOSURE (N/A)  Patient Location: ICU  Anesthesia Type:General  Level of Consciousness: sedated, unresponsive and Patient remains intubated per anesthesia plan  Airway & Oxygen Therapy: Patient remains intubated per anesthesia plan and Patient placed on Ventilator (see vital sign flow sheet for setting)  Post-op Assessment: Report given to RN and Post -op Vital signs reviewed and stable  Post vital signs: Reviewed and stable  Last Vitals:  Vitals:   04/15/16 0815 04/15/16 0830  BP:  120/70  Pulse: (!) 103 (!) 104  Resp: (!) 26 (!) 24  Temp:      Last Pain:  Vitals:   04/15/16 0740  TempSrc: Oral  PainSc:          Complications: No apparent anesthesia complications

## 2016-04-15 NOTE — Progress Notes (Signed)
Patient ID: Bill [name] XXXWatkins, male   DOB: [birth year], 35 y.o.   MRN: [id number]  Plan to re-explore today and attempt closure of the abdominal wall fascia

## 2016-04-15 NOTE — Anesthesia Postprocedure Evaluation (Signed)
Anesthesia Post Note  Patient: Ricahrd XXXWatkins  Procedure(s) Performed: Procedure(s) (LRB): WASHOUT WITH POSSIBLE ABDOMINAL CLOSURE (N/A)  Patient location during evaluation: ICU Anesthesia Type: General Level of consciousness: sedated and patient remains intubated per anesthesia plan Vital Signs Assessment: post-procedure vital signs reviewed and stable Respiratory status: patient on ventilator - see flowsheet for VS and patient remains intubated per anesthesia plan Cardiovascular status: blood pressure returned to baseline and stable Anesthetic complications: no    Last Vitals:  Vitals:   04/15/16 0815 04/15/16 0830  BP:  120/70  Pulse: (!) 103 (!) 104  Resp: (!) 26 (!) 24  Temp:      Last Pain:  Vitals:   04/15/16 0740  TempSrc: Oral  PainSc:                  Alanda AmassFRIEDMAN,Shericka Johnstone

## 2016-04-15 NOTE — Progress Notes (Signed)
Subjective: No significant changes.   Exam: Vitals:   04/15/16 0830 04/15/16 1017  BP: 120/70 (!) 143/91  Pulse: (!) 104 (!) 106  Resp: (!) 24 (!) 30  Temp:     Gen: In bed, NAD Resp: non-labored breathing, no acute distress Abd: soft, nt  Neuro: MS: does not open eyes or follow commands.  WU:JWJXBCN:PERRL, corneals intact bilaterally Motor: flexion to noxious stimulation bilaterally.  Sensory:as above.   Pertinent Labs: Na 148  Impression: 35 year old male with diffuse cerebral edema. To give this diffuse an appearance, metabolic or generalized hypoxic would be be the prime etiologies to consider. With intra-abdominal hypertension, cerebral edema can develop but with his open abdomen, this does not seem to be the case.   He did have some liver injury, but with no elevation in ammonia, I think this is an unlikely cuplrit.  It certainly does not seem that he sustained a severe enough traumatic brain injury to account for his findings.   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 He has been started on 3% saline with small response in Na, but his edema may be slightly improved on CT.   He has had abnormal movements, but EEG captured these and did not show epileptiform changes.   Recommendations: 1) increase 3% from 75 ml/hr to 17300ml/hr 2) avoid dextrose containing fluids.  3) continue to monitor.  4) I updated family on possibility of significant brain injury.   Ritta SlotMcNeill Philip Kotlyar, MD [facility] [phone]  If 7pm- 7am, please page neurology on call as listed in AMION.

## 2016-04-15 NOTE — Progress Notes (Signed)
eLink Physician-Brief Progress Note Patient Name: Bill [name] DOB: [birth year] MRN: [id number]   Date of Service  04/15/2016  HPI/Events of Note  hypophosphatemia and relative hypokalemia  eICU Interventions  Phos and potassium replaced     Intervention Category Intermediate Interventions: Electrolyte abnormality - evaluation and management  DETERDING,ELIZABETH 04/15/2016, 6:49 AM
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
## 2016-04-15 NOTE — Progress Notes (Signed)
Vent check late due to pt in OR this am

## 2016-04-16 ENCOUNTER — Encounter (HOSPITAL_COMMUNITY): Payer: Self-pay | Admitting: Surgery

## 2016-04-16 DIAGNOSIS — J918 Pleural effusion in other conditions classified elsewhere: Secondary | ICD-10-CM

## 2016-04-16 DIAGNOSIS — G936 Cerebral edema: Secondary | ICD-10-CM

## 2016-04-16 DIAGNOSIS — G931 Anoxic brain damage, not elsewhere classified: Secondary | ICD-10-CM

## 2016-04-16 LAB — GLUCOSE, CAPILLARY
GLUCOSE-CAPILLARY: 100 mg/dL — AB (ref 65–99)
GLUCOSE-CAPILLARY: 101 mg/dL — AB (ref 65–99)
GLUCOSE-CAPILLARY: 118 mg/dL — AB (ref 65–99)
GLUCOSE-CAPILLARY: 130 mg/dL — AB (ref 65–99)
Glucose-Capillary: 124 mg/dL — ABNORMAL HIGH (ref 65–99)
Glucose-Capillary: 110 mg/dL — ABNORMAL HIGH (ref 65–99)

## 2016-04-16 LAB — RENAL FUNCTION PANEL
ALBUMIN: 1.7 g/dL — AB (ref 3.5–5.0)
BUN: 11 mg/dL (ref 6–20)
CALCIUM: 7.4 mg/dL — AB (ref 8.9–10.3)
CO2: 26 mmol/L (ref 22–32)
CREATININE: 0.99 mg/dL (ref 0.61–1.24)
Chloride: >130 mmol/L (ref 101–111)
Glucose, Bld: 121 mg/dL — ABNORMAL HIGH (ref 65–99)
PHOSPHORUS: 1.6 mg/dL — AB (ref 2.5–4.6)
Potassium: 3.2 mmol/L — ABNORMAL LOW (ref 3.5–5.1)
SODIUM: 159 mmol/L — AB (ref 135–145)

## 2016-04-16 LAB — POCT I-STAT 3, ART BLOOD GAS (G3+)
ACID-BASE EXCESS: 2 mmol/L (ref 0.0–2.0)
Bicarbonate: 26.1 mmol/L (ref 20.0–28.0)
O2 SAT: 100 %
PCO2 ART: 36.1 mmHg (ref 32.0–48.0)
PO2 ART: 184 mmHg — AB (ref 83.0–108.0)
TCO2: 27 mmol/L (ref 0–100)
pH, Arterial: 7.467 — ABNORMAL HIGH (ref 7.350–7.450)

## 2016-04-16 LAB — CBC WITH DIFFERENTIAL/PLATELET
BASOS PCT: 0 %
Basophils Absolute: 0 10*3/uL (ref 0.0–0.1)
EOS ABS: 0.1 10*3/uL (ref 0.0–0.7)
Eosinophils Relative: 0 %
HEMATOCRIT: 34 % — AB (ref 39.0–52.0)
HEMOGLOBIN: 10.7 g/dL — AB (ref 13.0–17.0)
Lymphocytes Relative: 8 %
Lymphs Abs: 1.1 10*3/uL (ref 0.7–4.0)
MCH: 28.7 pg (ref 26.0–34.0)
MCHC: 31.5 g/dL (ref 30.0–36.0)
MCV: 91.2 fL (ref 78.0–100.0)
MONOS PCT: 19 %
Monocytes Absolute: 2.6 10*3/uL — ABNORMAL HIGH (ref 0.1–1.0)
NEUTROS ABS: 9.6 10*3/uL — AB (ref 1.7–7.7)
NEUTROS PCT: 72 %
Platelets: 113 10*3/uL — ABNORMAL LOW (ref 150–400)
RBC: 3.73 MIL/uL — AB (ref 4.22–5.81)
RDW: 15.6 % — ABNORMAL HIGH (ref 11.5–15.5)
WBC: 13.4 10*3/uL — AB (ref 4.0–10.5)

## 2016-04-16 LAB — PHOSPHORUS
PHOSPHORUS: 1.5 mg/dL — AB (ref 2.5–4.6)
Phosphorus: 1.8 mg/dL — ABNORMAL LOW (ref 2.5–4.6)

## 2016-04-16 LAB — SODIUM
SODIUM: 155 mmol/L — AB (ref 135–145)
SODIUM: 158 mmol/L — AB (ref 135–145)
SODIUM: 159 mmol/L — AB (ref 135–145)
Sodium: 161 mmol/L (ref 135–145)

## 2016-04-16 LAB — MAGNESIUM
MAGNESIUM: 2.4 mg/dL (ref 1.7–2.4)
Magnesium: 2.2 mg/dL (ref 1.7–2.4)
Magnesium: 2.4 mg/dL (ref 1.7–2.4)

## 2016-04-16 LAB — TRIGLYCERIDES: TRIGLYCERIDES: 282 mg/dL — AB (ref <150)

## 2016-04-16 MED ORDER — SELENIUM 50 MCG PO TABS
200.0000 ug | ORAL_TABLET | Freq: Every day | ORAL | Status: AC
  Administered 2016-04-16 – 2016-04-22 (×7): 200 ug
  Filled 2016-04-16 (×8): qty 4

## 2016-04-16 MED ORDER — FREE WATER
200.0000 mL | Freq: Four times a day (QID) | Status: DC
  Administered 2016-04-16 – 2016-04-17 (×6): 200 mL

## 2016-04-16 MED ORDER — VITAMIN C 500 MG PO TABS
1000.0000 mg | ORAL_TABLET | Freq: Three times a day (TID) | ORAL | Status: AC
Start: 2016-04-16 — End: 2016-04-22
  Administered 2016-04-16 – 2016-04-22 (×21): 1000 mg
  Filled 2016-04-16 (×20): qty 2

## 2016-04-16 MED ORDER — PIVOT 1.5 CAL PO LIQD
1000.0000 mL | ORAL | Status: DC
  Administered 2016-04-16 – 2016-04-19 (×4): 1000 mL

## 2016-04-16 MED ORDER — CHLORHEXIDINE GLUCONATE 0.12 % MT SOLN
OROMUCOSAL | Status: AC
  Administered 2016-04-16: 08:00:00
  Filled 2016-04-16: qty 15

## 2016-04-16 MED ORDER — PRO-STAT SUGAR FREE PO LIQD
60.0000 mL | Freq: Every day | ORAL | Status: DC
  Administered 2016-04-16 – 2016-04-20 (×19): 60 mL
  Filled 2016-04-16 (×19): qty 60

## 2016-04-16 MED ORDER — VITAL HIGH PROTEIN PO LIQD: 1000.0000 mL | ORAL | Status: DC

## 2016-04-16 MED ORDER — PRO-STAT SUGAR FREE PO LIQD
30.0000 mL | Freq: Two times a day (BID) | ORAL | Status: DC
  Administered 2016-04-16: 30 mL
  Filled 2016-04-16: qty 30
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
## 2016-04-16 NOTE — Progress Notes (Signed)
Neurology Progress Note  Subjective: No significant changes in his neurologic status have been reported.  He remains on hypertonic saline infusion with elevated sodium levels today. The patient remains intubated and sedated and thus cannot participate with review of systems.  Current Meds:   Current Facility-Administered Medications:  .  acetaminophen (TYLENOL) tablet 650 mg, 650 mg, Oral, Q6H PRN, Greer Pickerel, MD, 650 mg at 04/13/16 0236 .  artificial tears (LACRILUBE) ophthalmic ointment 1 application, 1 application, Both Eyes, Q8H, Judeth Horn, MD, 1 application at 53/97/67 1403 .  chlorhexidine gluconate (MEDLINE KIT) (PERIDEX) 0.12 % solution 15 mL, 15 mL, Mouth Rinse, BID, Stark Klein, MD, 15 mL at 04/16/16 0803 .  dextrose 5 %-0.9 % sodium chloride infusion, , Intravenous, Continuous, Clovis Riley, MD, Last Rate: 125 mL/hr at 04/16/16 1452 .  pantoprazole (PROTONIX) EC tablet 40 mg, 40 mg, Oral, Daily **OR** famotidine (PEPCID) IVPB 20 mg premix, 20 mg, Intravenous, Daily, Ralene Ok, MD, 20 mg at 04/16/16 1021 .  feeding supplement (PIVOT 1.5 CAL) liquid 1,000 mL, 1,000 mL, Per Tube, Continuous, Georganna Skeans, MD, Last Rate: 10 mL/hr at 04/16/16 1453, 1,000 mL at 04/16/16 1453 .  feeding supplement (PRO-STAT SUGAR FREE 64) liquid 60 mL, 60 mL, Per Tube, 5 X Daily, Georganna Skeans, MD .  fentaNYL (SUBLIMAZE) 2,500 mcg in sodium chloride 0.9 % 250 mL (10 mcg/mL) infusion, 100-300 mcg/hr, Intravenous, Continuous, Judeth Horn, MD, Last Rate: 25 mL/hr at 04/16/16 1500, 250 mcg/hr at 04/16/16 1500 .  fentaNYL (SUBLIMAZE) bolus via infusion 25-100 mcg, 25-100 mcg, Intravenous, Q30 min PRN, Javier Glazier, MD, 200 mcg at 04/15/16 3419 .  free water 200 mL, 200 mL, Per Tube, Q6H, Georganna Skeans, MD, 200 mL at 04/16/16 1504 .  insulin aspart (novoLOG) injection 0-9 Units, 0-9 Units, Subcutaneous, Q4H, Javier Glazier, MD, 1 Units at 04/16/16 0457 .  MEDLINE mouth rinse, 15 mL, Mouth  Rinse, 10 times per day, Stark Klein, MD, 15 mL at 04/16/16 1402 .  norepinephrine (LEVOPHED) 16 mg in sodium chloride 0.9 % 250 mL (0.064 mg/mL) infusion, 0-60 mcg/min, Intravenous, Titrated, Coralie Keens, MD .  propofol (DIPRIVAN) 1000 MG/100ML infusion, 25-80 mcg/kg/min, Intravenous, Continuous, Judeth Horn, MD, Last Rate: 13.2 mL/hr at 04/16/16 1500, 20 mcg/kg/min at 04/16/16 1500 .  selenium tablet 200 mcg, 200 mcg, Per Tube, Daily, Georganna Skeans, MD, 200 mcg at 04/16/16 1124 .  sodium chloride (hypertonic) 3 % solution, , Intravenous, Continuous, Greta Doom, MD, Stopped at 04/16/16 0800 .  vitamin C (ASCORBIC ACID) tablet 1,000 mg, 1,000 mg, Per Tube, Q8H, Georganna Skeans, MD, 1,000 mg at 04/16/16 1516  Objective:  Temp:  [98 F (36.7 C)-99.8 F (37.7 C)] 99.5 F (37.5 C) (09/25 1540) Pulse Rate:  [79-112] 94 (09/25 1540) Resp:  [24-31] 30 (09/25 1540) BP: (95-172)/(62-140) 148/92 (09/25 1540) SpO2:  [96 %-100 %] 97 % (09/25 1540) FiO2 (%):  [40 %-50 %] 40 % (09/25 1540) Weight:  [124.1 kg (273 lb 9.5 oz)] 124.1 kg (273 lb 9.5 oz) (09/25 1000)  General: WDWN African-American male in NAD. He is intubated and sedated with propofol infusion. He does not open his eyes to verbal or tactile stimulation. He does not follow any commands. HEENT: Neck is supple without lymphadenopathy. ETT in place. Sclerae are anicteric. There is mild conjunctival injection.  CV: Regular, no murmur. Distal pulses 2+ and symmetric.  Lungs: CTAB anteriorly. Extremities: Diffuse edema is present.. Neuro: MS: As noted above.   CN: Pupils  are equal and reactive from 3-->2 mm bilaterally. He does not blink to visual threat. His eyes are conjugate. Oculocephalics are sluggish. Corneals are present bilaterally. His face appears to be grossly symmetric but is partially obscured by tubes and tape. The remainder of his cranial nerves cannot be accurately assessed as he is unable to participate with the  examination.  Motor: Normal bulk. Tone is flaccid throughout. No spontaneous or purposeful movements are seen. No tremor or other abnormal movements are observed.  Sensation: He has no response to nailbed pressure 4.  DTRs: 2+, symmetric. Toes are mute bilaterally.   Coordination and gait: These cannot be assessed as the patient is unable to participate. nger-to-nose and heel-to-shin are without dysmetria bilaterally.    Labs: Lab Results  Component Value Date   WBC 13.4 (H) 04/16/2016   HGB 10.7 (L) 04/16/2016   HCT 34.0 (L) 04/16/2016   PLT 113 (L) 04/16/2016   GLUCOSE 121 (H) 04/16/2016   TRIG 282 (H) 04/16/2016   ALT 305 (H) 04/10/2016   AST 183 (H) 04/10/2016   NA 161 (HH) 04/16/2016   K 3.2 (L) 04/16/2016   CL >130 (HH) 04/16/2016   CREATININE 0.99 04/16/2016   BUN 11 04/16/2016   CO2 26 04/16/2016   INR 1.41 04/14/2016   HGBA1C 5.7 (H) 04/14/2016   CBC Latest Ref Rng & Units 04/16/2016 04/15/2016 04/14/2016  WBC 4.0 - 10.5 K/uL 13.4(H) 16.1(H) 16.6(H)  Hemoglobin 13.0 - 17.0 g/dL 10.7(L) 11.6(L) 9.7(L)  Hematocrit 39.0 - 52.0 % 34.0(L) 36.3(L) 30.4(L)  Platelets 150 - 400 K/uL 113(L) 91(L) 91(L)    Lab Results  Component Value Date   HGBA1C 5.7 (H) 04/14/2016   Lab Results  Component Value Date   ALT 305 (H) 04/10/2016   AST 183 (H) 04/10/2016   ALKPHOS 91 04/10/2016   BILITOT 0.6 04/10/2016    Radiology: There is no new neuroimaging.  A/P:   1. Cerebral edema: The precise etiology for his global cerebral edema is unclear. However, on reviewing flow sheets, he did have an extended period in the emergency department from 2202 to through 2021 where is documented to have persistent hypotension with systolic blood pressures ranging 63-86 mmHg. he also had intervals of hypoxia in the emergency department as well. These findings would support hypoxic ischemic brain injury is possible cause of his global cerebral edema. At this point, management is supportive. Continue  hypertonic saline as tolerated.   2. 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 Treatment is supportive. Every effort should be made to avoid any additional hypoxia or hypotension as this will compound his injury and significant worsening any neurologic prognosis. At this point, we will continue to follow this examination to assist with prognosis as much as possible. Sedating medications be minimized to the greatest extent possible. Continue to optimize metabolic status as you are.  This patient is critically ill and at significant risk of neurological worsening, death and care requires constant monitoring of vital signs, hemodynamics,respiratory and cardiac monitoring, neurological assessment, discussion with family, other specialists and medical decision making of high complexity. A total of 30 minutes of critical care time was spent on this case.   Melba Coon, MD [facility]
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
## 2016-04-16 NOTE — Progress Notes (Signed)
CRITICAL VALUE ALERT  Critical value received:  Chloride >130  Date of notification: 9/25  Time of notification:  0730  Critical value read back:Yes.    Nurse who received alert:  Verlin DikeKimberly Kelie Gainey  MD notified (1st page):  Dr. Janee Mornhompson  Time of first page:  0900  MD notified (2nd page):  Time of second page:  Responding MD:  Dr. Janee Mornhompson  Time MD responded: 0900

## 2016-04-16 NOTE — Progress Notes (Signed)
CRITICAL VALUE ALERT  Critical value received:  Sodium 161  Date of notification:  04/16/16  Time of notification:  1400  Critical value read back: yes  Nurse who received alert:  Morrie SheldonAshley  MD notified (1st page):  Dr. Janee Mornhompson  Time of first page:  1412  MD notified (2nd page):  Time of second page:  Responding MD:  Dr. Janee Mornhompson  Time MD responded:  1414

## 2016-04-16 NOTE — Progress Notes (Signed)
1 Day Post-Op Procedure(s) (LRB): WASHOUT WITH POSSIBLE ABDOMINAL CLOSURE (N/A) DEBRIDEMENT CLOSURE/ABDOMINAL WOUND Subjective:  On vent  Objective: Vital signs in last 24 hours: Temp:  [98 F (36.7 C)-99.8 F (37.7 C)] 99.5 F (37.5 C) (09/25 1540) Pulse Rate:  [79-114] 94 (09/25 1540) Cardiac Rhythm: Sinus tachycardia (09/25 0800) Resp:  [24-34] 30 (09/25 1540) BP: (95-172)/(62-140) 148/92 (09/25 1540) SpO2:  [96 %-100 %] 97 % (09/25 1540) FiO2 (%):  [40 %-50 %] 40 % (09/25 1540) Weight:  [124.1 kg (273 lb 9.5 oz)] 124.1 kg (273 lb 9.5 oz) (09/25 1000)  Hemodynamic parameters for last 24 hours: CVP:  [14 mmHg-24 mmHg] 21 mmHg  Intake/Output from previous day: 09/24 0701 - 09/25 0700 In: 3497.9 [I.V.:3497.9] Out: 2209 [Urine:1150; Drains:650; Blood:25; Chest Tube:384] Intake/Output this shift: Total I/O In: 862.4 [I.V.:392.4; NG/GT:470] Out: 410 [Urine:410]  Heart: regular rate and rhythm, S1, S2 normal, no murmur, click, rub or gallop Lungs: diminished breath sounds both lower lobes pericardial tube output low, serous.  Lab Results:  Recent Labs  04/15/16 0601 04/16/16 0600  WBC 16.1* 13.4*  HGB 11.6* 10.7*  HCT 36.3* 34.0*  PLT 91* 113*   BMET:  Recent Labs  04/15/16 0602  04/16/16 0600 04/16/16 1242  NA 147*  < > 159*  159* 161*  K 3.7  --  3.2*  --   CL 116*  --  >130*  --   CO2 27  --  26  --   GLUCOSE 127*  --  121*  --   BUN 7  --  11  --   CREATININE 1.02  --  0.99  --   CALCIUM 7.4*  --  7.4*  --   < > = values in this interval not displayed.  PT/INR:  Recent Labs  04/14/16 1030  LABPROT 17.4*  INR 1.41   ABG    Component Value Date/Time   PHART 7.467 (H) 04/16/2016 1105   HCO3 26.1 04/16/2016 1105   TCO2 27 04/16/2016 1105   ACIDBASEDEF 2.4 (H) 04/13/2016 0945   O2SAT 100.0 04/16/2016 1105   CBG (last 3)   Recent Labs  04/16/16 0435 04/16/16 0757 04/16/16 1130  GLUCAP 124* 110* 100*    Assessment/Plan:  He is  hemodynamically stable with vent dependent respiratory failure, cerebral edema and encephalopathy. Chest tube output fairly low. Chest CT from 9/22 reviewed. There is dense consolidation of the lower lobes of both lungs, probably contusion and pneumonia. There is no significant pleural effusion or pericardial effusion. I think the [address] pericardial tube can be removed. I would keep the pleural tubes in while on the vent.   LOS: 6 days    Alleen BorneBryan K Jw Covin 04/16/2016

## 2016-04-16 NOTE — Progress Notes (Signed)
   [facility] PCCM  Per RN (and she discussed with Trauma team), PCCM was consulted only for  the weekend.   Pls call back if with issues. PCCM will sign off for now.  Pollie MeyerJ. Angelo A de Dios, MD 04/16/2016, 1:04 PM [facility] Pager [phone] After 3 pm or if no answer, call [phone]

## 2016-04-17 ENCOUNTER — Inpatient Hospital Stay (HOSPITAL_COMMUNITY): Payer: Self-pay

## 2016-04-17 DIAGNOSIS — G253 Myoclonus: Secondary | ICD-10-CM

## 2016-04-17 LAB — GLUCOSE, CAPILLARY
GLUCOSE-CAPILLARY: 124 mg/dL — AB (ref 65–99)
Glucose-Capillary: 140 mg/dL — ABNORMAL HIGH (ref 65–99)
Glucose-Capillary: 121 mg/dL — ABNORMAL HIGH (ref 65–99)
Glucose-Capillary: 117 mg/dL — ABNORMAL HIGH (ref 65–99)
Glucose-Capillary: 110 mg/dL — ABNORMAL HIGH (ref 65–99)
Glucose-Capillary: 121 mg/dL — ABNORMAL HIGH (ref 65–99)

## 2016-04-17 LAB — BASIC METABOLIC PANEL
Anion gap: 3 — ABNORMAL LOW (ref 5–15)
BUN: 17 mg/dL (ref 6–20)
CHLORIDE: 127 mmol/L — AB (ref 101–111)
CO2: 27 mmol/L (ref 22–32)
CREATININE: 1.05 mg/dL (ref 0.61–1.24)
Calcium: 7.7 mg/dL — ABNORMAL LOW (ref 8.9–10.3)
GFR calc non Af Amer: >60 mL/min (ref >60)
Glucose, Bld: 147 mg/dL — ABNORMAL HIGH (ref 65–99)
Potassium: 3 mmol/L — ABNORMAL LOW (ref 3.5–5.1)
Sodium: 157 mmol/L — ABNORMAL HIGH (ref 135–145)

## 2016-04-17 LAB — SODIUM
SODIUM: 159 mmol/L — AB (ref 135–145)
SODIUM: 165 mmol/L — AB (ref 135–145)
Sodium: 159 mmol/L — ABNORMAL HIGH (ref 135–145)

## 2016-04-17 LAB — CBC
HEMATOCRIT: 34.2 % — AB (ref 39.0–52.0)
HEMOGLOBIN: 10.6 g/dL — AB (ref 13.0–17.0)
MCH: 28.6 pg (ref 26.0–34.0)
MCHC: 31 g/dL (ref 30.0–36.0)
MCV: 92.2 fL (ref 78.0–100.0)
Platelets: 155 10*3/uL (ref 150–400)
RBC: 3.71 MIL/uL — ABNORMAL LOW (ref 4.22–5.81)
RDW: 15.9 % — ABNORMAL HIGH (ref 11.5–15.5)
WBC: 15.3 10*3/uL — ABNORMAL HIGH (ref 4.0–10.5)

## 2016-04-17 LAB — PHOSPHORUS
PHOSPHORUS: 2.3 mg/dL — AB (ref 2.5–4.6)
Phosphorus: 2.5 mg/dL (ref 2.5–4.6)

## 2016-04-17 LAB — MAGNESIUM
MAGNESIUM: 2.5 mg/dL — AB (ref 1.7–2.4)
MAGNESIUM: 2.2 mg/dL (ref 1.7–2.4)

## 2016-04-17 MED ORDER — SODIUM CHLORIDE 0.9% FLUSH
10.0000 mL | Freq: Two times a day (BID) | INTRAVENOUS | Status: DC
  Administered 2016-04-17 (×2): 10 mL
  Administered 2016-04-18: 20 mL
  Administered 2016-04-18 – 2016-04-20 (×3): 10 mL
  Administered 2016-04-20: 30 mL
  Administered 2016-04-21 – 2016-04-23 (×5): 10 mL
  Administered 2016-04-24: 40 mL
  Administered 2016-04-24: 20 mL
  Administered 2016-04-25 – 2016-05-03 (×15): 10 mL

## 2016-04-17 MED ORDER — POTASSIUM CHLORIDE 10 MEQ/50ML IV SOLN
10.0000 meq | INTRAVENOUS | Status: AC
  Administered 2016-04-17 (×6): 10 meq via INTRAVENOUS
  Filled 2016-04-17 (×6): qty 50

## 2016-04-17 MED ORDER — SODIUM CHLORIDE 0.9% FLUSH: 10.0000 mL | INTRAVENOUS | Status: DC | PRN

## 2016-04-17 MED ORDER — ENOXAPARIN SODIUM 30 MG/0.3ML ~~LOC~~ SOLN
30.0000 mg | Freq: Two times a day (BID) | SUBCUTANEOUS | Status: DC
  Administered 2016-04-17 – 2016-04-28 (×23): 30 mg via SUBCUTANEOUS
  Filled 2016-04-17 (×22): qty 0.3

## 2016-04-17 MED ORDER — SODIUM CHLORIDE 0.9 % IV SOLN
INTRAVENOUS | Status: DC
  Administered 2016-04-17 (×2): via INTRAVENOUS
  Administered 2016-04-18: 1000 mL via INTRAVENOUS

## 2016-04-17 MED ORDER — FREE WATER
200.0000 mL | Status: DC
  Administered 2016-04-17 – 2016-04-18 (×4): 200 mL
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
## 2016-04-17 NOTE — Progress Notes (Signed)
Peripherally Inserted Central Catheter/Midline Placement  The IV Nurse has discussed with the patient and/or persons authorized to consent for the patient, the purpose of this procedure and the potential benefits and risks involved with this procedure.  The benefits include less needle sticks, lab draws from the catheter, and the patient may be discharged home with the catheter. Risks include, but not limited to, infection, bleeding, blood clot (thrombus formation), and puncture of an artery; nerve damage and irregular heartbeat and possibility to perform a PICC exchange if needed/ordered by physician.  Alternatives to this procedure were also discussed.  Bard Power PICC patient education guide, fact sheet on infection prevention and patient information card has been provided to patient /or left at bedside.    PICC/Midline Placement Documentation        Bill [name], Bill [name] 04/17/2016, 2:10 PM

## 2016-04-18 ENCOUNTER — Encounter (HOSPITAL_COMMUNITY): Payer: Self-pay | Admitting: *Deleted

## 2016-04-18 ENCOUNTER — Inpatient Hospital Stay (HOSPITAL_COMMUNITY): Payer: Self-pay

## 2016-04-18 LAB — CBC
HCT: 33.3 % — ABNORMAL LOW (ref 39.0–52.0)
HEMOGLOBIN: 10.1 g/dL — AB (ref 13.0–17.0)
MCH: 28.4 pg (ref 26.0–34.0)
MCHC: 30.3 g/dL (ref 30.0–36.0)
MCV: 93.5 fL (ref 78.0–100.0)
Platelets: 206 10*3/uL (ref 150–400)
RBC: 3.56 MIL/uL — ABNORMAL LOW (ref 4.22–5.81)
RDW: 16.3 % — AB (ref 11.5–15.5)
WBC: 18.1 10*3/uL — ABNORMAL HIGH (ref 4.0–10.5)

## 2016-04-18 LAB — SODIUM
SODIUM: 159 mmol/L — AB (ref 135–145)
Sodium: 157 mmol/L — ABNORMAL HIGH (ref 135–145)

## 2016-04-18 LAB — GLUCOSE, CAPILLARY
GLUCOSE-CAPILLARY: 123 mg/dL — AB (ref 65–99)
GLUCOSE-CAPILLARY: 104 mg/dL — AB (ref 65–99)
GLUCOSE-CAPILLARY: 117 mg/dL — AB (ref 65–99)
Glucose-Capillary: 127 mg/dL — ABNORMAL HIGH (ref 65–99)
Glucose-Capillary: 109 mg/dL — ABNORMAL HIGH (ref 65–99)
Glucose-Capillary: 97 mg/dL (ref 65–99)

## 2016-04-18 LAB — BASIC METABOLIC PANEL
ANION GAP: 8 (ref 5–15)
BUN: 24 mg/dL — ABNORMAL HIGH (ref 6–20)
CALCIUM: 7.9 mg/dL — AB (ref 8.9–10.3)
CO2: 28 mmol/L (ref 22–32)
Chloride: 124 mmol/L — ABNORMAL HIGH (ref 101–111)
Creatinine, Ser: 1 mg/dL (ref 0.61–1.24)
GFR calc Af Amer: >60 mL/min (ref >60)
GLUCOSE: 121 mg/dL — AB (ref 65–99)
Potassium: 3.4 mmol/L — ABNORMAL LOW (ref 3.5–5.1)
Sodium: 160 mmol/L — ABNORMAL HIGH (ref 135–145)

## 2016-04-18 MED ORDER — CLONAZEPAM 0.1 MG/ML ORAL SUSPENSION
1.0000 mg | Freq: Three times a day (TID) | ORAL | Status: DC
  Filled 2016-04-18: qty 10

## 2016-04-18 MED ORDER — INFLUENZA VAC SPLIT QUAD 0.5 ML IM SUSY
0.5000 mL | PREFILLED_SYRINGE | INTRAMUSCULAR | Status: AC
  Administered 2016-04-19: 0.5 mL via INTRAMUSCULAR
  Filled 2016-04-18: qty 0.5

## 2016-04-18 MED ORDER — CLONAZEPAM 1 MG PO TABS
1.0000 mg | ORAL_TABLET | Freq: Three times a day (TID) | ORAL | Status: DC
  Administered 2016-04-18 – 2016-04-19 (×3): 1 mg
  Filled 2016-04-18 (×3): qty 1

## 2016-04-18 MED ORDER — QUETIAPINE FUMARATE 100 MG PO TABS
100.0000 mg | ORAL_TABLET | Freq: Three times a day (TID) | ORAL | Status: DC
  Administered 2016-04-18 – 2016-04-25 (×21): 100 mg
  Filled 2016-04-18 (×21): qty 1

## 2016-04-18 MED ORDER — FREE WATER
300.0000 mL | Status: DC
  Administered 2016-04-18 – 2016-04-24 (×36): 300 mL
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
## 2016-04-18 NOTE — Anesthesia Postprocedure Evaluation (Signed)
Anesthesia Post Note  Patient: Bill [name]  Procedure(s) Performed: Procedure(s) (LRB): IRRIGATION AND DEBRIDEMENT OPEN ABDOMEN, REPLACEMENT OF ABDOMINAL VAC SPONGE (N/A)  Patient location during evaluation: ICU Anesthesia Type: General Level of consciousness: sedated and patient remains intubated per anesthesia plan Pain management: pain level controlled Vital Signs Assessment: post-procedure vital signs reviewed and stable Cardiovascular status: stable Anesthetic complications: no    Last Vitals:  Vitals:   04/18/16 1200 04/18/16 1300  BP: 135/84 133/64  Pulse: (!) 104 90  Resp: (!) 27 (!) 31  Temp: 37.3 C     Last Pain:  Vitals:   04/18/16 1200  TempSrc: Axillary  PainSc:                  Bill [name] S

## 2016-04-18 NOTE — Progress Notes (Signed)
RT note- Ventilator changes per Dr. Lindie SpruceWyatt, follow up  ABG pending.
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
## 2016-04-19 ENCOUNTER — Inpatient Hospital Stay (HOSPITAL_COMMUNITY): Payer: Self-pay

## 2016-04-19 LAB — CBC
HEMATOCRIT: 35.4 % — AB (ref 39.0–52.0)
HEMOGLOBIN: 10.8 g/dL — AB (ref 13.0–17.0)
MCH: 28.7 pg (ref 26.0–34.0)
MCHC: 30.5 g/dL (ref 30.0–36.0)
MCV: 94.1 fL (ref 78.0–100.0)
Platelets: 235 10*3/uL (ref 150–400)
RBC: 3.76 MIL/uL — ABNORMAL LOW (ref 4.22–5.81)
RDW: 16.3 % — ABNORMAL HIGH (ref 11.5–15.5)
WBC: 20.5 10*3/uL — ABNORMAL HIGH (ref 4.0–10.5)

## 2016-04-19 LAB — BASIC METABOLIC PANEL
Anion gap: 3 — ABNORMAL LOW (ref 5–15)
BUN: 26 mg/dL — ABNORMAL HIGH (ref 6–20)
CHLORIDE: 121 mmol/L — AB (ref 101–111)
CO2: 30 mmol/L (ref 22–32)
CREATININE: 0.92 mg/dL (ref 0.61–1.24)
Calcium: 7.8 mg/dL — ABNORMAL LOW (ref 8.9–10.3)
GFR calc non Af Amer: >60 mL/min (ref >60)
GLUCOSE: 116 mg/dL — AB (ref 65–99)
Potassium: 3.2 mmol/L — ABNORMAL LOW (ref 3.5–5.1)
Sodium: 154 mmol/L — ABNORMAL HIGH (ref 135–145)

## 2016-04-19 LAB — GLUCOSE, CAPILLARY
Glucose-Capillary: 107 mg/dL — ABNORMAL HIGH (ref 65–99)
Glucose-Capillary: 97 mg/dL (ref 65–99)
Glucose-Capillary: 118 mg/dL — ABNORMAL HIGH (ref 65–99)
Glucose-Capillary: 127 mg/dL — ABNORMAL HIGH (ref 65–99)
Glucose-Capillary: 106 mg/dL — ABNORMAL HIGH (ref 65–99)
Glucose-Capillary: 126 mg/dL — ABNORMAL HIGH (ref 65–99)

## 2016-04-19 LAB — BLOOD GAS, ARTERIAL
Acid-Base Excess: 3 mmol/L — ABNORMAL HIGH (ref 0.0–2.0)
Bicarbonate: 27.8 mmol/L (ref 20.0–28.0)
DRAWN BY: 12971
FIO2: 40
MECHVT: 500 mL
O2 Saturation: 97.7 %
PEEP: 5 cmH2O
PH ART: 7.374 (ref 7.350–7.450)
Patient temperature: 98.6
RATE: 25 resp/min
pCO2 arterial: 48.9 mmHg — ABNORMAL HIGH (ref 32.0–48.0)
pO2, Arterial: 105 mmHg (ref 83.0–108.0)

## 2016-04-19 LAB — TRIGLYCERIDES: TRIGLYCERIDES: 369 mg/dL — AB (ref <150)

## 2016-04-19 MED ORDER — FUROSEMIDE 8 MG/ML PO SOLN: 40.0000 mg | Freq: Two times a day (BID) | ORAL | Status: DC

## 2016-04-19 MED ORDER — POTASSIUM CHLORIDE 10 MEQ/50ML IV SOLN
10.0000 meq | INTRAVENOUS | Status: AC
  Administered 2016-04-19 (×4): 10 meq via INTRAVENOUS
  Filled 2016-04-19 (×4): qty 50

## 2016-04-19 MED ORDER — PIPERACILLIN-TAZOBACTAM 3.375 G IVPB
3.3750 g | Freq: Three times a day (TID) | INTRAVENOUS | Status: DC
  Administered 2016-04-19 – 2016-04-22 (×9): 3.375 g via INTRAVENOUS
  Filled 2016-04-19 (×10): qty 50

## 2016-04-19 MED ORDER — FUROSEMIDE 40 MG PO TABS
40.0000 mg | ORAL_TABLET | Freq: Two times a day (BID) | ORAL | Status: DC
  Administered 2016-04-19 – 2016-04-26 (×15): 40 mg
  Filled 2016-04-19 (×15): qty 1

## 2016-04-19 MED ORDER — CLONAZEPAM 1 MG PO TABS
1.5000 mg | ORAL_TABLET | Freq: Three times a day (TID) | ORAL | Status: DC
  Administered 2016-04-19 – 2016-04-25 (×18): 1.5 mg
  Filled 2016-04-19 (×18): qty 1
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
## 2016-04-19 NOTE — Progress Notes (Signed)
Unable to wean any sedation, even a little bit D/T pt non stop gagging/coughing. During fits pt's sats dropped down as low as 60. Multiple boluses required to get pt to stop and become synchronous with the vent.
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
## 2016-04-20 ENCOUNTER — Inpatient Hospital Stay (HOSPITAL_COMMUNITY): Payer: Self-pay

## 2016-04-20 LAB — BLOOD CULTURE ID PANEL (REFLEXED)
Acinetobacter baumannii: NOT DETECTED
CANDIDA ALBICANS: NOT DETECTED
CANDIDA KRUSEI: NOT DETECTED
CANDIDA PARAPSILOSIS: NOT DETECTED
CANDIDA TROPICALIS: NOT DETECTED
Candida glabrata: NOT DETECTED
ENTEROBACTERIACEAE SPECIES: NOT DETECTED
ESCHERICHIA COLI: NOT DETECTED
Enterobacter cloacae complex: NOT DETECTED
Enterococcus species: NOT DETECTED
HAEMOPHILUS INFLUENZAE: NOT DETECTED
KLEBSIELLA OXYTOCA: NOT DETECTED
KLEBSIELLA PNEUMONIAE: NOT DETECTED
Listeria monocytogenes: NOT DETECTED
Methicillin resistance: DETECTED — AB
Neisseria meningitidis: NOT DETECTED
Proteus species: NOT DETECTED
Pseudomonas aeruginosa: NOT DETECTED
SERRATIA MARCESCENS: NOT DETECTED
STREPTOCOCCUS AGALACTIAE: NOT DETECTED
STREPTOCOCCUS PNEUMONIAE: NOT DETECTED
STREPTOCOCCUS SPECIES: NOT DETECTED
Staphylococcus aureus (BCID): NOT DETECTED
Staphylococcus species: DETECTED — AB
Streptococcus pyogenes: NOT DETECTED

## 2016-04-20 LAB — GLUCOSE, CAPILLARY
GLUCOSE-CAPILLARY: 105 mg/dL — AB (ref 65–99)
GLUCOSE-CAPILLARY: 105 mg/dL — AB (ref 65–99)
GLUCOSE-CAPILLARY: 120 mg/dL — AB (ref 65–99)
Glucose-Capillary: 106 mg/dL — ABNORMAL HIGH (ref 65–99)
Glucose-Capillary: 129 mg/dL — ABNORMAL HIGH (ref 65–99)
Glucose-Capillary: 126 mg/dL — ABNORMAL HIGH (ref 65–99)

## 2016-04-20 LAB — CBC
HCT: 33.7 % — ABNORMAL LOW (ref 39.0–52.0)
Hemoglobin: 10.1 g/dL — ABNORMAL LOW (ref 13.0–17.0)
MCH: 28.4 pg (ref 26.0–34.0)
MCHC: 30 g/dL (ref 30.0–36.0)
MCV: 94.7 fL (ref 78.0–100.0)
PLATELETS: 302 10*3/uL (ref 150–400)
RBC: 3.56 MIL/uL — ABNORMAL LOW (ref 4.22–5.81)
RDW: 15.7 % — AB (ref 11.5–15.5)
WBC: 22.3 10*3/uL — ABNORMAL HIGH (ref 4.0–10.5)

## 2016-04-20 LAB — BASIC METABOLIC PANEL
Anion gap: 3 — ABNORMAL LOW (ref 5–15)
BUN: 22 mg/dL — AB (ref 6–20)
CALCIUM: 7.5 mg/dL — AB (ref 8.9–10.3)
CO2: 33 mmol/L — ABNORMAL HIGH (ref 22–32)
Chloride: 116 mmol/L — ABNORMAL HIGH (ref 101–111)
Creatinine, Ser: 0.92 mg/dL (ref 0.61–1.24)
GFR calc Af Amer: >60 mL/min (ref >60)
GLUCOSE: 118 mg/dL — AB (ref 65–99)
Potassium: 2.9 mmol/L — ABNORMAL LOW (ref 3.5–5.1)
Sodium: 152 mmol/L — ABNORMAL HIGH (ref 135–145)

## 2016-04-20 LAB — URINE CULTURE: Culture: NO GROWTH

## 2016-04-20 MED ORDER — SODIUM CHLORIDE 0.9 % IV SOLN
1250.0000 mg | Freq: Three times a day (TID) | INTRAVENOUS | Status: DC
  Administered 2016-04-20 – 2016-04-23 (×10): 1250 mg via INTRAVENOUS
  Filled 2016-04-20 (×12): qty 1250

## 2016-04-20 MED ORDER — PRO-STAT SUGAR FREE PO LIQD
60.0000 mL | Freq: Four times a day (QID) | ORAL | Status: DC
  Administered 2016-04-20 – 2016-04-27 (×28): 60 mL
  Filled 2016-04-20 (×27): qty 60

## 2016-04-20 MED ORDER — FENTANYL 50 MCG/HR TD PT72
100.0000 ug | MEDICATED_PATCH | TRANSDERMAL | Status: DC
  Administered 2016-04-20 – 2016-04-26 (×3): 100 ug via TRANSDERMAL
  Filled 2016-04-20 (×3): qty 2

## 2016-04-20 MED ORDER — POTASSIUM CHLORIDE 10 MEQ/50ML IV SOLN
INTRAVENOUS | Status: AC
  Filled 2016-04-20: qty 50

## 2016-04-20 MED ORDER — POTASSIUM CHLORIDE 10 MEQ/50ML IV SOLN
10.0000 meq | INTRAVENOUS | Status: AC
  Administered 2016-04-20 (×6): 10 meq via INTRAVENOUS
  Filled 2016-04-20 (×5): qty 50

## 2016-04-20 MED ORDER — METOPROLOL TARTRATE 25 MG/10 ML ORAL SUSPENSION
25.0000 mg | Freq: Two times a day (BID) | ORAL | Status: DC
  Administered 2016-04-20 – 2016-04-23 (×7): 25 mg
  Filled 2016-04-20 (×6): qty 10

## 2016-04-20 MED ORDER — PIVOT 1.5 CAL PO LIQD
1000.0000 mL | ORAL | Status: DC
  Administered 2016-04-21 – 2016-04-25 (×5): 1000 mL

## 2016-04-20 NOTE — Progress Notes (Signed)
PHARMACY - PHYSICIAN COMMUNICATION CRITICAL VALUE ALERT - BLOOD CULTURE IDENTIFICATION (BCID)  Results for orders placed or performed during the hospital encounter of 04/10/16  Blood Culture ID Panel (Reflexed) (Collected: 04/19/2016 10:45 AM)  Result Value Ref Range   Enterococcus species NOT DETECTED NOT DETECTED   Listeria monocytogenes NOT DETECTED NOT DETECTED   Staphylococcus species DETECTED (A) NOT DETECTED   Staphylococcus aureus NOT DETECTED NOT DETECTED   Methicillin resistance DETECTED (A) NOT DETECTED   Streptococcus species NOT DETECTED NOT DETECTED   Streptococcus agalactiae NOT DETECTED NOT DETECTED   Streptococcus pneumoniae NOT DETECTED NOT DETECTED   Streptococcus pyogenes NOT DETECTED NOT DETECTED   Acinetobacter baumannii NOT DETECTED NOT DETECTED   Enterobacteriaceae species NOT DETECTED NOT DETECTED   Enterobacter cloacae complex NOT DETECTED NOT DETECTED   Escherichia coli NOT DETECTED NOT DETECTED   Klebsiella oxytoca NOT DETECTED NOT DETECTED   Klebsiella pneumoniae NOT DETECTED NOT DETECTED   Proteus species NOT DETECTED NOT DETECTED   Serratia marcescens NOT DETECTED NOT DETECTED   Haemophilus influenzae NOT DETECTED NOT DETECTED   Neisseria meningitidis NOT DETECTED NOT DETECTED   Pseudomonas aeruginosa NOT DETECTED NOT DETECTED   Candida albicans NOT DETECTED NOT DETECTED   Candida glabrata NOT DETECTED NOT DETECTED   Candida krusei NOT DETECTED NOT DETECTED   Candida parapsilosis NOT DETECTED NOT DETECTED   Candida tropicalis NOT DETECTED NOT DETECTED    Name of physician (or Provider) Contacted:  Freeman CaldronMichael J. Jeffery  Changes to prescribed antibiotics required: add vancomycin based on leukocytosis and fever.    Pollyann SamplesAndy Kino Dunsworth, PharmD, BCPS 04/20/2016, 9:29 AM Pager: [phone]
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
## 2016-04-21 ENCOUNTER — Inpatient Hospital Stay (HOSPITAL_COMMUNITY): Payer: Self-pay

## 2016-04-21 LAB — GLUCOSE, CAPILLARY
GLUCOSE-CAPILLARY: 98 mg/dL (ref 65–99)
GLUCOSE-CAPILLARY: 101 mg/dL — AB (ref 65–99)
GLUCOSE-CAPILLARY: 112 mg/dL — AB (ref 65–99)
Glucose-Capillary: 101 mg/dL — ABNORMAL HIGH (ref 65–99)
Glucose-Capillary: 133 mg/dL — ABNORMAL HIGH (ref 65–99)
Glucose-Capillary: 108 mg/dL — ABNORMAL HIGH (ref 65–99)

## 2016-04-21 LAB — CBC
HEMATOCRIT: 35.5 % — AB (ref 39.0–52.0)
HEMOGLOBIN: 10.5 g/dL — AB (ref 13.0–17.0)
MCH: 28.4 pg (ref 26.0–34.0)
MCHC: 29.6 g/dL — AB (ref 30.0–36.0)
MCV: 95.9 fL (ref 78.0–100.0)
Platelets: 362 10*3/uL (ref 150–400)
RBC: 3.7 MIL/uL — ABNORMAL LOW (ref 4.22–5.81)
RDW: 15.5 % (ref 11.5–15.5)
WBC: 21.8 10*3/uL — ABNORMAL HIGH (ref 4.0–10.5)

## 2016-04-21 LAB — BASIC METABOLIC PANEL
Anion gap: 5 (ref 5–15)
BUN: 24 mg/dL — ABNORMAL HIGH (ref 6–20)
CALCIUM: 7.5 mg/dL — AB (ref 8.9–10.3)
CHLORIDE: 108 mmol/L (ref 101–111)
CO2: 33 mmol/L — AB (ref 22–32)
CREATININE: 0.8 mg/dL (ref 0.61–1.24)
GFR calc non Af Amer: >60 mL/min (ref >60)
GLUCOSE: 115 mg/dL — AB (ref 65–99)
Potassium: 3.2 mmol/L — ABNORMAL LOW (ref 3.5–5.1)
Sodium: 146 mmol/L — ABNORMAL HIGH (ref 135–145)

## 2016-04-21 LAB — CULTURE, RESPIRATORY W GRAM STAIN

## 2016-04-21 LAB — CULTURE, RESPIRATORY: SPECIAL REQUESTS: NORMAL

## 2016-04-21 LAB — VANCOMYCIN, TROUGH: VANCOMYCIN TR: 15 ug/mL (ref 15–20)

## 2016-04-21 MED ORDER — POTASSIUM CHLORIDE 20 MEQ/15ML (10%) PO SOLN
40.0000 meq | Freq: Two times a day (BID) | ORAL | Status: AC
  Administered 2016-04-21 (×2): 40 meq
  Filled 2016-04-21 (×2): qty 30

## 2016-04-21 MED ORDER — METOPROLOL TARTRATE 5 MG/5ML IV SOLN
5.0000 mg | INTRAVENOUS | Status: DC | PRN
  Administered 2016-04-21 – 2016-05-04 (×4): 5 mg via INTRAVENOUS
  Filled 2016-04-21 (×5): qty 5

## 2016-04-21 MED ORDER — POLYETHYLENE GLYCOL 3350 17 G PO PACK
17.0000 g | PACK | Freq: Every day | ORAL | Status: DC
  Administered 2016-04-21 – 2016-04-27 (×6): 17 g
  Filled 2016-04-21 (×6): qty 1

## 2016-04-21 MED ORDER — BISACODYL 10 MG RE SUPP
10.0000 mg | Freq: Every day | RECTAL | Status: DC | PRN
Start: 2016-04-21 — End: 2016-05-08
  Filled 2016-04-21: qty 1

## 2016-04-21 MED ORDER — METOPROLOL TARTRATE 5 MG/5ML IV SOLN
5.0000 mg | Freq: Once | INTRAVENOUS | Status: AC
  Administered 2016-04-21: 5 mg via INTRAVENOUS
  Filled 2016-04-21: qty 5
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
## 2016-04-21 NOTE — Progress Notes (Signed)
Patients ETT pulled back 2cm by RT per Dr. Harlon Florseui without any complications. ETT now 21 at the lip.

## 2016-04-22 ENCOUNTER — Inpatient Hospital Stay (HOSPITAL_COMMUNITY): Payer: Self-pay

## 2016-04-22 LAB — GLUCOSE, CAPILLARY
GLUCOSE-CAPILLARY: 99 mg/dL (ref 65–99)
GLUCOSE-CAPILLARY: 141 mg/dL — AB (ref 65–99)
Glucose-Capillary: 103 mg/dL — ABNORMAL HIGH (ref 65–99)
Glucose-Capillary: 124 mg/dL — ABNORMAL HIGH (ref 65–99)
Glucose-Capillary: 114 mg/dL — ABNORMAL HIGH (ref 65–99)
Glucose-Capillary: 116 mg/dL — ABNORMAL HIGH (ref 65–99)

## 2016-04-22 LAB — BASIC METABOLIC PANEL
Anion gap: 4 — ABNORMAL LOW (ref 5–15)
BUN: 21 mg/dL — AB (ref 6–20)
CALCIUM: 7.8 mg/dL — AB (ref 8.9–10.3)
CO2: 36 mmol/L — AB (ref 22–32)
CREATININE: 0.84 mg/dL (ref 0.61–1.24)
Chloride: 106 mmol/L (ref 101–111)
GFR calc non Af Amer: >60 mL/min (ref >60)
Glucose, Bld: 122 mg/dL — ABNORMAL HIGH (ref 65–99)
Potassium: 3.7 mmol/L (ref 3.5–5.1)
SODIUM: 146 mmol/L — AB (ref 135–145)

## 2016-04-22 LAB — CBC
HEMATOCRIT: 35 % — AB (ref 39.0–52.0)
Hemoglobin: 10.3 g/dL — ABNORMAL LOW (ref 13.0–17.0)
MCH: 28.3 pg (ref 26.0–34.0)
MCHC: 29.4 g/dL — AB (ref 30.0–36.0)
MCV: 96.2 fL (ref 78.0–100.0)
Platelets: 408 10*3/uL — ABNORMAL HIGH (ref 150–400)
RBC: 3.64 MIL/uL — ABNORMAL LOW (ref 4.22–5.81)
RDW: 15.1 % (ref 11.5–15.5)
WBC: 18.7 10*3/uL — ABNORMAL HIGH (ref 4.0–10.5)

## 2016-04-22 LAB — CULTURE, BLOOD (ROUTINE X 2)

## 2016-04-22 MED ORDER — CIPROFLOXACIN IN D5W 400 MG/200ML IV SOLN
400.0000 mg | Freq: Two times a day (BID) | INTRAVENOUS | Status: AC
  Administered 2016-04-22 – 2016-05-01 (×20): 400 mg via INTRAVENOUS
  Filled 2016-04-22 (×21): qty 200
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
## 2016-04-23 DIAGNOSIS — I493 Ventricular premature depolarization: Secondary | ICD-10-CM

## 2016-04-23 DIAGNOSIS — W3400XA Accidental discharge from unspecified firearms or gun, initial encounter: Secondary | ICD-10-CM

## 2016-04-23 DIAGNOSIS — I471 Supraventricular tachycardia: Secondary | ICD-10-CM

## 2016-04-23 DIAGNOSIS — S299XXS Unspecified injury of thorax, sequela: Secondary | ICD-10-CM

## 2016-04-23 LAB — GLUCOSE, CAPILLARY
GLUCOSE-CAPILLARY: 124 mg/dL — AB (ref 65–99)
GLUCOSE-CAPILLARY: 112 mg/dL — AB (ref 65–99)
GLUCOSE-CAPILLARY: 116 mg/dL — AB (ref 65–99)
GLUCOSE-CAPILLARY: 114 mg/dL — AB (ref 65–99)
Glucose-Capillary: 104 mg/dL — ABNORMAL HIGH (ref 65–99)
Glucose-Capillary: 124 mg/dL — ABNORMAL HIGH (ref 65–99)

## 2016-04-23 MED ORDER — METOPROLOL TARTRATE 25 MG/10 ML ORAL SUSPENSION
50.0000 mg | Freq: Two times a day (BID) | ORAL | Status: DC
  Administered 2016-04-23 – 2016-04-24 (×2): 50 mg
  Filled 2016-04-23 (×2): qty 20

## 2016-04-23 NOTE — Progress Notes (Signed)
Patient ID: Bill [name], male   DOB: August 29, 1980, 35 y.o.   MRN: [id number] I tried to call his mother per her request. No answer, no VM. I will try again. [name] [name], MD, MPH, FACS Trauma: [phone] General Surgery: [phone]

## 2016-04-23 NOTE — Progress Notes (Signed)
Pt placed on wean C/PS 5/5 and did not tolerate due to increased RR and increased HR.  PS increased to 10 and then 12 and pt seems more comfortable.  RR has decreased into the 20's and HR has also decreased.
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
## 2016-04-23 NOTE — Progress Notes (Signed)
Patient ID: Nathen Maymmitt XXXWatkins, male   DOB: Sep 28, 1980, 35 y.o.   MRN: [id number] I spoke with his mother on the phone regarding the plan of care, goals of care, and possible trach/PEG. She is adamantly against him being placed in a SNF. 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 I explained to her that as of now, after trach/PEG, if his neurologic function did not improve, the usual plan would be placement in ventilator dependent skilled nursing facility versus skilled nursing facility depending on his ability to wean from the ventilator. She is willing to consider trach/PEG later this week. She remains hopeful he will recover neurologically so [place] live with her at home as the next step. We discussed this plan in detail including my considerable concerns with home placement in light of how much care he will need based on his current condition. We agreed to speak again this week and tentatively plan for trach and PEG on Thursday. [name] Chrisie Jankovich, MD, MPH, FACS Trauma: [phone] General Surgery: [phone]

## 2016-04-23 NOTE — Progress Notes (Signed)
Pt placed back on full support due to agitation and increased RR.  RT will continue to monitor.

## 2016-04-24 ENCOUNTER — Inpatient Hospital Stay (HOSPITAL_COMMUNITY): Payer: Self-pay

## 2016-04-24 DIAGNOSIS — R9431 Abnormal electrocardiogram [ECG] [EKG]: Secondary | ICD-10-CM

## 2016-04-24 DIAGNOSIS — I471 Supraventricular tachycardia: Secondary | ICD-10-CM

## 2016-04-24 LAB — CULTURE, BLOOD (ROUTINE X 2): CULTURE: NO GROWTH

## 2016-04-24 LAB — BASIC METABOLIC PANEL
Anion gap: 7 (ref 5–15)
BUN: 25 mg/dL — AB (ref 6–20)
CALCIUM: 8 mg/dL — AB (ref 8.9–10.3)
CHLORIDE: 98 mmol/L — AB (ref 101–111)
CO2: 35 mmol/L — AB (ref 22–32)
CREATININE: 0.85 mg/dL (ref 0.61–1.24)
GFR calc non Af Amer: >60 mL/min (ref >60)
GLUCOSE: 126 mg/dL — AB (ref 65–99)
Potassium: 3.2 mmol/L — ABNORMAL LOW (ref 3.5–5.1)
Sodium: 140 mmol/L (ref 135–145)

## 2016-04-24 LAB — CBC
HEMATOCRIT: 32.3 % — AB (ref 39.0–52.0)
HEMOGLOBIN: 9.8 g/dL — AB (ref 13.0–17.0)
MCH: 28.2 pg (ref 26.0–34.0)
MCHC: 30.3 g/dL (ref 30.0–36.0)
MCV: 93.1 fL (ref 78.0–100.0)
Platelets: 578 10*3/uL — ABNORMAL HIGH (ref 150–400)
RBC: 3.47 MIL/uL — ABNORMAL LOW (ref 4.22–5.81)
RDW: 14.7 % (ref 11.5–15.5)
WBC: 21 10*3/uL — ABNORMAL HIGH (ref 4.0–10.5)

## 2016-04-24 LAB — GLUCOSE, CAPILLARY
GLUCOSE-CAPILLARY: 130 mg/dL — AB (ref 65–99)
GLUCOSE-CAPILLARY: 101 mg/dL — AB (ref 65–99)
GLUCOSE-CAPILLARY: 104 mg/dL — AB (ref 65–99)
GLUCOSE-CAPILLARY: 114 mg/dL — AB (ref 65–99)
Glucose-Capillary: 125 mg/dL — ABNORMAL HIGH (ref 65–99)
Glucose-Capillary: 124 mg/dL — ABNORMAL HIGH (ref 65–99)

## 2016-04-24 LAB — ECHOCARDIOGRAM COMPLETE
HEIGHTINCHES: 70 in
Weight: 4518.55 oz

## 2016-04-24 MED ORDER — POTASSIUM CHLORIDE 20 MEQ/15ML (10%) PO SOLN
40.0000 meq | Freq: Two times a day (BID) | ORAL | Status: AC
  Administered 2016-04-24 (×2): 40 meq via ORAL
  Filled 2016-04-24 (×2): qty 30

## 2016-04-24 MED ORDER — METOPROLOL TARTRATE 25 MG/10 ML ORAL SUSPENSION: 25.0000 mg | Freq: Three times a day (TID) | ORAL | Status: DC

## 2016-04-24 MED ORDER — METOPROLOL TARTRATE 25 MG/10 ML ORAL SUSPENSION
25.0000 mg | Freq: Four times a day (QID) | ORAL | Status: DC
  Administered 2016-04-24 – 2016-04-25 (×5): 25 mg
  Filled 2016-04-24 (×5): qty 10

## 2016-04-24 MED ORDER — FREE WATER
200.0000 mL | Freq: Four times a day (QID) | Status: DC
  Administered 2016-04-24 – 2016-04-27 (×11): 200 mL

## 2016-04-24 MED ORDER — CHLORHEXIDINE GLUCONATE 0.12 % MT SOLN
OROMUCOSAL | Status: AC
  Administered 2016-04-24: 15 mL via OROMUCOSAL
  Filled 2016-04-24: qty 15
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
## 2016-04-24 NOTE — Progress Notes (Signed)
Subjective: Patient is making small improvements on a daily basis. At this point patient is able to blink to command, look in the direction of practitioner's voice, wiggle fingers minimally, answer yes and no by nodding his head.  Exam: Vitals:   04/24/16 0700 04/24/16 0800  BP: 108/60 130/75  Pulse: (!) 110 80  Resp: 20 (!) 25  Temp:        Gen: In bed, NAD MS: Alert and able to follow simple commands CN: Pupils equal round reactive to light, extraocular movements intact, face symmetrical, currently intubated and breathing over the vent. Motor: Patient shows small amounts of movements with his fingers and toes however he is not able to lift his extremities off the bed. He is able to follow commands when asked to wiggle his toes and fingers. Sensory: Sensation grossly intact throughout. Likely diminished secondary to significant edema throughout.   Pertinent Labs/Diagnostics: None    Impression:  Anoxic brain injury: Patient continues to make small improvements on a daily basis.  Long-term prognosis is variable difficult to predict at this time. He should likely will need significant rehabilitation after discharge. No further recommendations at this time. Neurology will sign off. If you have further questions please feel free to contact us.    Felicie MornDavid Smith PA-C [facility] [phone]  04/24/2016, 9:09 AM

## 2016-04-24 NOTE — Progress Notes (Signed)
  Echocardiogram 2D Echocardiogram has been performed.  Bill [name], Bill [name] 04/24/2016, 10:44 AM

## 2016-04-25 ENCOUNTER — Inpatient Hospital Stay (HOSPITAL_COMMUNITY): Payer: Self-pay

## 2016-04-25 LAB — CBC
HCT: 30.4 % — ABNORMAL LOW (ref 39.0–52.0)
Hemoglobin: 9 g/dL — ABNORMAL LOW (ref 13.0–17.0)
MCH: 28.3 pg (ref 26.0–34.0)
MCHC: 29.6 g/dL — ABNORMAL LOW (ref 30.0–36.0)
MCV: 95.6 fL (ref 78.0–100.0)
PLATELETS: 627 10*3/uL — AB (ref 150–400)
RBC: 3.18 MIL/uL — AB (ref 4.22–5.81)
RDW: 14.6 % (ref 11.5–15.5)
WBC: 16.5 10*3/uL — AB (ref 4.0–10.5)

## 2016-04-25 LAB — BASIC METABOLIC PANEL
ANION GAP: 8 (ref 5–15)
BUN: 25 mg/dL — AB (ref 6–20)
CALCIUM: 8 mg/dL — AB (ref 8.9–10.3)
CO2: 33 mmol/L — ABNORMAL HIGH (ref 22–32)
Chloride: 100 mmol/L — ABNORMAL LOW (ref 101–111)
Creatinine, Ser: 0.83 mg/dL (ref 0.61–1.24)
GFR calc Af Amer: >60 mL/min (ref >60)
Glucose, Bld: 134 mg/dL — ABNORMAL HIGH (ref 65–99)
POTASSIUM: 4.2 mmol/L (ref 3.5–5.1)
SODIUM: 141 mmol/L (ref 135–145)

## 2016-04-25 LAB — GLUCOSE, CAPILLARY
GLUCOSE-CAPILLARY: 130 mg/dL — AB (ref 65–99)
GLUCOSE-CAPILLARY: 113 mg/dL — AB (ref 65–99)
GLUCOSE-CAPILLARY: 102 mg/dL — AB (ref 65–99)
GLUCOSE-CAPILLARY: 118 mg/dL — AB (ref 65–99)
Glucose-Capillary: 112 mg/dL — ABNORMAL HIGH (ref 65–99)
Glucose-Capillary: 131 mg/dL — ABNORMAL HIGH (ref 65–99)

## 2016-04-25 LAB — HEPATIC FUNCTION PANEL
ALBUMIN: 1.6 g/dL — AB (ref 3.5–5.0)
ALK PHOS: 106 U/L (ref 38–126)
ALT: 42 U/L (ref 17–63)
AST: 74 U/L — AB (ref 15–41)
BILIRUBIN INDIRECT: 0.3 mg/dL (ref 0.3–0.9)
Bilirubin, Direct: 0.4 mg/dL (ref 0.1–0.5)
TOTAL PROTEIN: 5.7 g/dL — AB (ref 6.5–8.1)
Total Bilirubin: 0.7 mg/dL (ref 0.3–1.2)

## 2016-04-25 MED ORDER — FENTANYL 2500MCG IN NS 250ML (10MCG/ML) PREMIX INFUSION
25.0000 ug/h | INTRAVENOUS | Status: DC
  Administered 2016-04-26 (×2): 100 ug/h via INTRAVENOUS
  Filled 2016-04-25 (×2): qty 250

## 2016-04-25 MED ORDER — CLONAZEPAM 1 MG PO TABS
1.0000 mg | ORAL_TABLET | Freq: Three times a day (TID) | ORAL | Status: DC
  Administered 2016-04-25 – 2016-04-27 (×6): 1 mg
  Filled 2016-04-25 (×6): qty 1

## 2016-04-25 MED ORDER — QUETIAPINE FUMARATE 100 MG PO TABS
50.0000 mg | ORAL_TABLET | Freq: Three times a day (TID) | ORAL | Status: DC
  Administered 2016-04-25 – 2016-04-27 (×6): 50 mg
  Filled 2016-04-25 (×6): qty 1

## 2016-04-25 MED ORDER — METOPROLOL TARTRATE 25 MG/10 ML ORAL SUSPENSION
50.0000 mg | Freq: Three times a day (TID) | ORAL | Status: DC
  Administered 2016-04-25 – 2016-04-27 (×5): 50 mg
  Filled 2016-04-25 (×5): qty 20
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
## 2016-04-26 ENCOUNTER — Inpatient Hospital Stay (HOSPITAL_COMMUNITY): Payer: Self-pay

## 2016-04-26 LAB — GLUCOSE, CAPILLARY
GLUCOSE-CAPILLARY: 118 mg/dL — AB (ref 65–99)
GLUCOSE-CAPILLARY: 96 mg/dL (ref 65–99)
Glucose-Capillary: 110 mg/dL — ABNORMAL HIGH (ref 65–99)
Glucose-Capillary: 116 mg/dL — ABNORMAL HIGH (ref 65–99)
Glucose-Capillary: 118 mg/dL — ABNORMAL HIGH (ref 65–99)
Glucose-Capillary: 136 mg/dL — ABNORMAL HIGH (ref 65–99)

## 2016-04-26 LAB — BASIC METABOLIC PANEL
Anion gap: 7 (ref 5–15)
BUN: 24 mg/dL — ABNORMAL HIGH (ref 6–20)
CHLORIDE: 99 mmol/L — AB (ref 101–111)
CO2: 34 mmol/L — AB (ref 22–32)
Calcium: 8.2 mg/dL — ABNORMAL LOW (ref 8.9–10.3)
Creatinine, Ser: 0.77 mg/dL (ref 0.61–1.24)
GFR calc non Af Amer: >60 mL/min (ref >60)
Glucose, Bld: 119 mg/dL — ABNORMAL HIGH (ref 65–99)
POTASSIUM: 4 mmol/L (ref 3.5–5.1)
SODIUM: 140 mmol/L (ref 135–145)

## 2016-04-26 LAB — CBC
HEMATOCRIT: 30.3 % — AB (ref 39.0–52.0)
HEMOGLOBIN: 8.8 g/dL — AB (ref 13.0–17.0)
MCH: 27.8 pg (ref 26.0–34.0)
MCHC: 29 g/dL — ABNORMAL LOW (ref 30.0–36.0)
MCV: 95.9 fL (ref 78.0–100.0)
Platelets: 699 10*3/uL — ABNORMAL HIGH (ref 150–400)
RBC: 3.16 MIL/uL — AB (ref 4.22–5.81)
RDW: 14.5 % (ref 11.5–15.5)
WBC: 15.6 10*3/uL — ABNORMAL HIGH (ref 4.0–10.5)

## 2016-04-26 LAB — BRAIN NATRIURETIC PEPTIDE: B Natriuretic Peptide: 78.2 pg/mL (ref 0.0–100.0)

## 2016-04-26 MED ORDER — FUROSEMIDE 40 MG PO TABS
40.0000 mg | ORAL_TABLET | Freq: Two times a day (BID) | ORAL | Status: DC
  Administered 2016-04-27: 40 mg
  Filled 2016-04-26: qty 1

## 2016-04-26 MED ORDER — FUROSEMIDE 10 MG/ML IJ SOLN
40.0000 mg | Freq: Once | INTRAMUSCULAR | Status: AC
  Administered 2016-04-26: 40 mg via INTRAVENOUS
  Filled 2016-04-26: qty 4
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
## 2016-04-26 NOTE — Progress Notes (Signed)
Received message that pt's mother has questions for RN Case Manager.  Called pt's mother, Bill [name] (cell [phone]); she has questions regarding Medicaid application status.  Referred mother to Bill [name] in Financial Counseling, who is handling this case; phone [phone].  Mrs. Bill [name] appreciative of help.    Bill BatonJulie W. Zaylee Cornia, RN, BSN  Trauma/Neuro ICU Case Manager [phone]

## 2016-04-26 NOTE — Progress Notes (Signed)
Patient ID: Bill [name] XXXWatkins, male   DOB: 1981-01-01, 35 y.o.   MRN: [id number] Continues to wean, now on 10/5. Moved R arm slightly to command. I called his mother and gave her an update. [name] Ashraf Mesta, MD, MPH, FACS Trauma: [phone] General Surgery: [phone]

## 2016-04-27 ENCOUNTER — Inpatient Hospital Stay (HOSPITAL_COMMUNITY): Payer: Self-pay

## 2016-04-27 LAB — CBC
HCT: 29.6 % — ABNORMAL LOW (ref 39.0–52.0)
Hemoglobin: 8.8 g/dL — ABNORMAL LOW (ref 13.0–17.0)
MCH: 28.4 pg (ref 26.0–34.0)
MCHC: 29.7 g/dL — ABNORMAL LOW (ref 30.0–36.0)
MCV: 95.5 fL (ref 78.0–100.0)
PLATELETS: 773 10*3/uL — AB (ref 150–400)
RBC: 3.1 MIL/uL — ABNORMAL LOW (ref 4.22–5.81)
RDW: 14.1 % (ref 11.5–15.5)
WBC: 13.5 10*3/uL — AB (ref 4.0–10.5)

## 2016-04-27 LAB — BASIC METABOLIC PANEL
ANION GAP: 6 (ref 5–15)
BUN: 24 mg/dL — ABNORMAL HIGH (ref 6–20)
CALCIUM: 8.5 mg/dL — AB (ref 8.9–10.3)
CO2: 38 mmol/L — ABNORMAL HIGH (ref 22–32)
Chloride: 97 mmol/L — ABNORMAL LOW (ref 101–111)
Creatinine, Ser: 0.79 mg/dL (ref 0.61–1.24)
GLUCOSE: 122 mg/dL — AB (ref 65–99)
Potassium: 3.6 mmol/L (ref 3.5–5.1)
SODIUM: 141 mmol/L (ref 135–145)

## 2016-04-27 LAB — GLUCOSE, CAPILLARY
GLUCOSE-CAPILLARY: 112 mg/dL — AB (ref 65–99)
GLUCOSE-CAPILLARY: 102 mg/dL — AB (ref 65–99)
Glucose-Capillary: 120 mg/dL — ABNORMAL HIGH (ref 65–99)
Glucose-Capillary: 106 mg/dL — ABNORMAL HIGH (ref 65–99)

## 2016-04-27 MED ORDER — METOPROLOL TARTRATE 5 MG/5ML IV SOLN
5.0000 mg | Freq: Three times a day (TID) | INTRAVENOUS | Status: DC
  Administered 2016-04-27 – 2016-04-28 (×3): 5 mg via INTRAVENOUS
  Filled 2016-04-27 (×3): qty 5

## 2016-04-27 MED ORDER — SODIUM CHLORIDE 0.45 % IV SOLN
INTRAVENOUS | Status: DC
  Administered 2016-04-27 – 2016-05-04 (×7): via INTRAVENOUS
  Filled 2016-04-27 (×17): qty 1000
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
## 2016-04-27 NOTE — Procedures (Signed)
Extubation Procedure Note  Patient Details:   Name: Nathen Maymmitt XXXWatkins DOB: [birth year] MRN: [id number]   Airway Documentation:     Evaluation  O2 sats: stable throughout Complications: No apparent complications Patient did tolerate procedure well. Bilateral Breath Sounds: Diminished   No, Placed on 4L [place] SPO[address] 100%  Toula MoosCampbell, Minoru Chap Faulkner 04/27/2016, 10:41 AM
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
## 2016-04-27 NOTE — Progress Notes (Signed)
eLink Physician-Brief Progress Note Patient Name: Bill [name] XXXWatkins DOB: 11/08/1980 MRN: [id number]   Date of Service  04/27/2016  HPI/Events of Note  Camera check on patient postextubation at 10:41 AM. Currently on nasal cannula oxygen. Sleeping with eyes closed. Respiratory rate 23 breaths per minute. Heart rate 104. Saturating 99%. BP stable. No signs of distress. Mildly increased work of breathing.   eICU Interventions  Continuing ICU monitoring and plan of care per primary service.      Intervention Category Major Interventions: Respiratory failure - evaluation and management  Lawanda CousinsJennings Sabrin Dunlevy 04/27/2016, 10:18 PM

## 2016-04-27 NOTE — Progress Notes (Signed)
Notified Cortrak nurse of pt needing cortrak tube.
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
## 2016-04-28 ENCOUNTER — Inpatient Hospital Stay (HOSPITAL_COMMUNITY): Payer: Self-pay

## 2016-04-28 ENCOUNTER — Inpatient Hospital Stay (HOSPITAL_COMMUNITY): Payer: Self-pay | Admitting: Anesthesiology

## 2016-04-28 ENCOUNTER — Encounter (HOSPITAL_COMMUNITY): Payer: Self-pay | Admitting: Radiology

## 2016-04-28 DIAGNOSIS — I480 Paroxysmal atrial fibrillation: Secondary | ICD-10-CM

## 2016-04-28 LAB — CBC
HEMATOCRIT: 30.9 % — AB (ref 39.0–52.0)
HEMOGLOBIN: 9.1 g/dL — AB (ref 13.0–17.0)
MCH: 27.7 pg (ref 26.0–34.0)
MCHC: 29.4 g/dL — AB (ref 30.0–36.0)
MCV: 94.2 fL (ref 78.0–100.0)
Platelets: 675 10*3/uL — ABNORMAL HIGH (ref 150–400)
RBC: 3.28 MIL/uL — AB (ref 4.22–5.81)
RDW: 13.7 % (ref 11.5–15.5)
WBC: 15.4 10*3/uL — ABNORMAL HIGH (ref 4.0–10.5)

## 2016-04-28 MED ORDER — IOPAMIDOL (ISOVUE-300) INJECTION 61%
INTRAVENOUS | Status: AC
  Administered 2016-04-28: 13:00:00
  Filled 2016-04-28: qty 100

## 2016-04-28 MED ORDER — FENTANYL CITRATE (PF) 100 MCG/2ML IJ SOLN: 50.0000 ug | Freq: Once | INTRAMUSCULAR | Status: DC

## 2016-04-28 MED ORDER — FENTANYL CITRATE (PF) 100 MCG/2ML IJ SOLN: 100.0000 ug | INTRAMUSCULAR | Status: DC | PRN

## 2016-04-28 MED ORDER — FENTANYL BOLUS VIA INFUSION
50.0000 ug | INTRAVENOUS | Status: DC | PRN
  Administered 2016-04-29 (×2): 50 ug via INTRAVENOUS
  Filled 2016-04-28: qty 50

## 2016-04-28 MED ORDER — PROPOFOL 500 MG/50ML IV EMUL
INTRAVENOUS | Status: AC
  Filled 2016-04-28: qty 50

## 2016-04-28 MED ORDER — FENTANYL CITRATE (PF) 100 MCG/2ML IJ SOLN
100.0000 ug | INTRAMUSCULAR | Status: DC | PRN
  Administered 2016-04-28 (×2): 100 ug via INTRAVENOUS
  Filled 2016-04-28: qty 2

## 2016-04-28 MED ORDER — SUCCINYLCHOLINE CHLORIDE 20 MG/ML IJ SOLN
INTRAMUSCULAR | Status: DC | PRN
  Administered 2016-04-28: 120 mg via INTRAVENOUS

## 2016-04-28 MED ORDER — PROPOFOL 10 MG/ML IV BOLUS
INTRAVENOUS | Status: DC | PRN
  Administered 2016-04-28: 80 mg via INTRAVENOUS

## 2016-04-28 MED ORDER — METOPROLOL TARTRATE 5 MG/5ML IV SOLN
5.0000 mg | Freq: Four times a day (QID) | INTRAVENOUS | Status: DC
  Administered 2016-04-28 – 2016-05-04 (×22): 5 mg via INTRAVENOUS
  Filled 2016-04-28 (×22): qty 5

## 2016-04-28 MED ORDER — FENTANYL 2500MCG IN NS 250ML (10MCG/ML) PREMIX INFUSION
25.0000 ug/h | INTRAVENOUS | Status: DC
Start: 2016-04-28 — End: 2016-04-29
  Administered 2016-04-29: 25 ug/h via INTRAVENOUS
  Filled 2016-04-28 (×2): qty 250

## 2016-04-28 MED ORDER — FENTANYL CITRATE (PF) 100 MCG/2ML IJ SOLN
INTRAMUSCULAR | Status: AC
  Filled 2016-04-28: qty 2

## 2016-04-28 MED ORDER — PROPOFOL 1000 MG/100ML IV EMUL
0.0000 ug/kg/min | INTRAVENOUS | Status: DC
  Administered 2016-04-28: 25 ug/kg/min via INTRAVENOUS
  Administered 2016-04-28: 30 ug/kg/min via INTRAVENOUS
  Administered 2016-04-28: 20 ug/kg/min via INTRAVENOUS
  Administered 2016-04-28: 30 ug/kg/min via INTRAVENOUS
  Administered 2016-04-29: 18 ug/kg/min via INTRAVENOUS
  Filled 2016-04-28 (×5): qty 100

## 2016-04-28 MED ORDER — MIDAZOLAM HCL 2 MG/2ML IJ SOLN: 2.0000 mg | INTRAMUSCULAR | Status: DC | PRN
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
## 2016-04-28 NOTE — Progress Notes (Addendum)
Subjective:  Currently extubated.  Not real communicative but states he is doing okay today.  No shortness of breath, significant pain.  Objective:  Vital Signs in the last 24 hours: BP 130/78   Pulse 97   Temp 99.5 F (37.5 C) (Oral)   Resp (!) 24   Ht 5\' 10"  (1.778 m)   Wt 116.4 kg (256 lb 9.9 oz)   SpO2 99%   BMI 36.82 kg/m   Physical Exam:  Mildly obese black male mildly uncomfortable  Lungs:  Clear  Cardiac:  Regular rhythm, normal S1 and S2, no S3 Extremities:  No edema present  Intake/Output from previous day: 10/06 0701 - 10/07 0700 In: 1633.3 [I.V.:828.3; NG/GT:355; IV Piggyback:450] Out: 2825 [Urine:2460; Drains:315; Chest Tube:50] Weight Filed Weights   04/26/16 0216 04/27/16 0400 04/28/16 0500  Weight: 123.2 kg (271 lb 9.7 oz) 118.2 kg (260 lb 9.3 oz) 116.4 kg (256 lb 9.9 oz)    Lab Results: Basic Metabolic Panel:  Recent Labs  16/04/9609/05/17 0457 04/27/16 0500  NA 140 141  K 4.0 3.6  CL 99* 97*  CO2 34* 38*  GLUCOSE 119* 122*  BUN 24* 24*  CREATININE 0.77 0.79    CBC:  Recent Labs  04/27/16 0500 04/28/16 0530  WBC 13.5* 15.4*  HGB 8.8* 9.1*  HCT 29.6* 30.9*  MCV 95.5 94.2  PLT 773* 675*    BNP    Component Value Date/Time   BNP 78.2 04/26/2016 1748   Telemetry: Sinus rhythm currently but has episodes of nonsustained atrial fibrillation with rapid response  Assessment/Plan:  1.  Having paroxysmal atrial fibrillation likely due to Prior paracardial window and drain it was and there was some residual inflammation.  If blood pressure stable I would up titrate the beta blocker and continue treatment for his other issues.  He is not currently taken medicines by mouth so will increase to every 6 hours but may use additional metoprolol as needed.  Hopefully this will settle down     W. Ashley RoyaltySpencer Dhilan Brauer, Jr.  MD [facility]  04/28/2016, 11:17 AM
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
## 2016-04-28 NOTE — Progress Notes (Signed)
Pt has had 4-5 episodes where his pulse rate will rise as high as 175 for a few seconds and then comes right back down to normal. Pt is receiving scheduled heart rate medication. I will report to oncoming RN to be addressed with cardiology and primary care physician during the day tomorrow unless it continues or sustains for more than a few seconds.

## 2016-04-28 NOTE — Procedures (Addendum)
Intubation Procedure Note Bill [name] XXXWatkins [id number] [birth year]  Procedure: Intubation Indications: Airway protection and maintenance  Procedure Details Consent: Risks of procedure as well as the alternatives and risks of each were explained to the (patient/caregiver).  Consent for procedure obtained. Time Out: Verified patient identification, verified procedure, site/side was marked, verified correct patient position, special equipment/implants available, medications/allergies/relevent history reviewed, required imaging and test results available.  Performed  Maximum sterile technique was used including gloves, gown, hand hygiene and mask.  MAC and 4    Evaluation Hemodynamic Status: Persistent hypotension treated with fluid; O2 sats: stable throughout Patient's Current Condition: stable Complications: No apparent complications Patient did tolerate procedure well. Chest X-ray ordered to verify placement.  CXR: pending.  Pt intubated by CRNA using glidescope #4 blade with 7.5 ett secured at 25 at the lip. Positive color change noted on etco2, bilateral Rhonchi BS throughout, CXR pending. Post CXR, ETT pulled back to 23 at the lip.     Carolan ShiverKelley, Alexio Sroka M 04/28/2016

## 2016-04-28 NOTE — Anesthesia Procedure Notes (Signed)
Procedure Name: Intubation Date/Time: 04/28/2016 4:32 PM Performed by: [name], Jorgeluis Gurganus D Pre-anesthesia Checklist: Patient identified, Emergency Drugs available, Suction available, Patient being monitored and Timeout performed Patient Re-evaluated:Patient Re-evaluated prior to inductionOxygen Delivery Method: Circle system utilized Preoxygenation: Pre-oxygenation with 100% oxygen Intubation Type: IV induction Laryngoscope Size: Glidescope (Elective glidescope) Grade View: Grade I Tube type: Subglottic suction tube Tube size: 7.5 mm Number of attempts: 1 Airway Equipment and Method: Stylet and Video-laryngoscopy Placement Confirmation: ETT inserted through vocal cords under direct vision,  CO2 detector and breath sounds checked- equal and bilateral Secured at: 23 cm Tube secured with: Tape Dental Injury: Teeth and Oropharynx as per pre-operative assessment

## 2016-04-28 NOTE — Addendum Note (Signed)
Addendum  created 04/28/16 1656 by Val Eaglehristopher Oluwakemi Salsberry, MD   Anesthesia Attestations filed
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
## 2016-04-28 NOTE — Progress Notes (Signed)
Per RN. Pt to be reintubated for Stat imaging.  Will d/c cortrak consult at this time.   Christophe LouisNathan Adlai Sinning RD, LDN, CNSC Clinical Nutrition Pager: [phone] 04/28/2016 3:56 PM

## 2016-04-28 NOTE — Progress Notes (Signed)
Stat CT report read by Dr.Wakefield. New orders received, awaiting anesthesia,and Stat MRI.
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
## 2016-04-28 NOTE — Transfer of Care (Signed)
Immediate Anesthesia Transfer of Care Note  Patient: Bill [name]  Procedure(s) Performed: * No procedures listed *  Patient Location: NICU  Anesthesia Type:General  Level of Consciousness: sedated  Airway & Oxygen Therapy: Patient remains intubated per anesthesia plan and Patient placed on Ventilator (see vital sign flow sheet for setting)  Post-op Assessment: Report given to RN and Post -op Vital signs reviewed and stable  Post vital signs: Reviewed and stable  Last Vitals:  Vitals:   04/28/16 1600 04/28/16 1618  BP:  (!) 192/168  Pulse:  88  Resp:  (!) 24  Temp: 37.7 C     Last Pain:  Vitals:   04/28/16 1600  TempSrc: Oral  PainSc:          Complications: No apparent anesthesia complications

## 2016-04-29 DIAGNOSIS — I4891 Unspecified atrial fibrillation: Secondary | ICD-10-CM

## 2016-04-29 LAB — TRIGLYCERIDES: Triglycerides: 185 mg/dL — ABNORMAL HIGH (ref <150)

## 2016-04-29 MED ORDER — FUROSEMIDE 10 MG/ML IJ SOLN
40.0000 mg | Freq: Once | INTRAMUSCULAR | Status: AC
  Administered 2016-04-29: 40 mg via INTRAVENOUS
  Filled 2016-04-29: qty 4

## 2016-04-29 MED ORDER — OXYCODONE-ACETAMINOPHEN 5-325 MG PO TABS: 1.0000 | ORAL_TABLET | Freq: Four times a day (QID) | ORAL | Status: DC | PRN

## 2016-04-29 MED ORDER — ORAL CARE MOUTH RINSE
15.0000 mL | Freq: Every day | OROMUCOSAL | Status: DC
Start: 2016-04-30 — End: 2016-04-29

## 2016-04-29 MED ORDER — MORPHINE SULFATE (PF) 2 MG/ML IV SOLN
2.0000 mg | INTRAVENOUS | Status: DC | PRN
  Administered 2016-04-30 (×4): 4 mg via INTRAVENOUS
  Administered 2016-04-30 (×2): 2 mg via INTRAVENOUS
  Administered 2016-05-01 (×2): 4 mg via INTRAVENOUS
  Filled 2016-04-29 (×7): qty 2

## 2016-04-29 MED ORDER — ORAL CARE MOUTH RINSE
15.0000 mL | OROMUCOSAL | Status: DC
Start: 2016-04-30 — End: 2016-05-01
  Administered 2016-04-30 – 2016-05-01 (×4): 15 mL via OROMUCOSAL
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
## 2016-04-29 NOTE — Progress Notes (Signed)
Patient states he is hungry. RN has explained we need a swallow evaluation prior to his diet being reinstated. RN has paged on call trauma PA for order placement.

## 2016-04-29 NOTE — Procedures (Signed)
Extubation Procedure Note  Patient Details:   Name: Bill [name] DOB: Sep 10, 1980 MRN: [id number]   Airway Documentation:     Evaluation  O2 sats: stable throughout Complications: No apparent complications Patient did tolerate procedure well. Bilateral Breath Sounds: Rhonchi, Fine crackles   Yes   Pt extubated to 3L [place] per MD order. Pt stable throughout with no complications. Pt only able to whisper name and questions and has a weak but productive cough. Pt given Yankauer and encouraged to use it to clear thick secretions. RT will continue to monitor.   Ermalinda BarriosKelley, Azeem Poorman M 04/29/2016, 10:20 AM
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
## 2016-04-29 NOTE — Progress Notes (Signed)
Patient extubated successfully. On 3L [employer] at this time.

## 2016-04-29 NOTE — Progress Notes (Signed)
Patient doing well since extubation. Vitals have remained stable, no complaint of pain. Patient is able to move all extremities to some degree, right side is better than left with LUE still very weak and decreased movement in comparison to RUE. Family has been here to visit which has included his estranged wife, a cousin, and another male relative. Patient was offered a bath today at quiet time but refused as the cousin was allowed to come in to visit for a short time. Patient has expressed multiple times that he is hungry, will need swallow evaluation before PO will be authorized per physician.
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
## 2016-04-29 NOTE — Progress Notes (Signed)
Fentanyl and propofol turned off per verbal order. Will wean from ventilator and prepare for extubation if able.

## 2016-04-29 NOTE — Progress Notes (Signed)
Contacted patients wife per his request. RN has asked her to come visit per patient and she has agreed.

## 2016-04-30 ENCOUNTER — Encounter (HOSPITAL_COMMUNITY): Payer: Self-pay | Admitting: *Deleted

## 2016-04-30 ENCOUNTER — Inpatient Hospital Stay (HOSPITAL_COMMUNITY): Payer: Self-pay

## 2016-04-30 DIAGNOSIS — W3400XS Accidental discharge from unspecified firearms or gun, sequela: Secondary | ICD-10-CM

## 2016-04-30 DIAGNOSIS — S21101S Unspecified open wound of right front wall of thorax without penetration into thoracic cavity, sequela: Secondary | ICD-10-CM

## 2016-04-30 DIAGNOSIS — I480 Paroxysmal atrial fibrillation: Secondary | ICD-10-CM

## 2016-04-30 LAB — BASIC METABOLIC PANEL
ANION GAP: 8 (ref 5–15)
BUN: 9 mg/dL (ref 6–20)
CALCIUM: 8.1 mg/dL — AB (ref 8.9–10.3)
CO2: 32 mmol/L (ref 22–32)
CREATININE: 0.8 mg/dL (ref 0.61–1.24)
Chloride: 98 mmol/L — ABNORMAL LOW (ref 101–111)
Glucose, Bld: 95 mg/dL (ref 65–99)
Potassium: 3.5 mmol/L (ref 3.5–5.1)
SODIUM: 138 mmol/L (ref 135–145)

## 2016-04-30 LAB — CBC
HCT: 29.3 % — ABNORMAL LOW (ref 39.0–52.0)
Hemoglobin: 9 g/dL — ABNORMAL LOW (ref 13.0–17.0)
MCH: 28 pg (ref 26.0–34.0)
MCHC: 30.7 g/dL (ref 30.0–36.0)
MCV: 91.3 fL (ref 78.0–100.0)
PLATELETS: 827 10*3/uL — AB (ref 150–400)
RBC: 3.21 MIL/uL — ABNORMAL LOW (ref 4.22–5.81)
RDW: 13.1 % (ref 11.5–15.5)
WBC: 17.5 10*3/uL — AB (ref 4.0–10.5)

## 2016-04-30 MED ORDER — PIVOT 1.5 CAL PO LIQD
1000.0000 mL | ORAL | Status: DC
  Administered 2016-05-01 – 2016-05-02 (×2): 1000 mL
  Filled 2016-04-30 (×11): qty 1000

## 2016-04-30 MED ORDER — ENOXAPARIN SODIUM 40 MG/0.4ML ~~LOC~~ SOLN
40.0000 mg | SUBCUTANEOUS | Status: DC
  Administered 2016-04-30: 40 mg via SUBCUTANEOUS
  Filled 2016-04-30: qty 0.4

## 2016-04-30 MED ORDER — PIVOT 1.5 CAL PO LIQD
1000.0000 mL | ORAL | Status: DC
Start: 2016-04-30 — End: 2016-04-30
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
## 2016-04-30 NOTE — Progress Notes (Signed)
Rehab Admissions Coordinator Note:  Patient was screened by Trish MageLogue, Kerrie Timm M for appropriateness for an Inpatient Acute Rehab Consult.  Noted PT/OT recommending CIR.  At this time, we are recommending Inpatient Rehab consult.  Trish MageLogue, Aizley Stenseth M 04/30/2016, 12:18 PM  I can be reached at [phone].
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
## 2016-05-01 ENCOUNTER — Inpatient Hospital Stay (HOSPITAL_COMMUNITY): Payer: Self-pay

## 2016-05-01 LAB — CBC
HEMATOCRIT: 30.2 % — AB (ref 39.0–52.0)
Hemoglobin: 9.6 g/dL — ABNORMAL LOW (ref 13.0–17.0)
MCH: 28.1 pg (ref 26.0–34.0)
MCHC: 31.8 g/dL (ref 30.0–36.0)
MCV: 88.3 fL (ref 78.0–100.0)
PLATELETS: 822 10*3/uL — AB (ref 150–400)
RBC: 3.42 MIL/uL — ABNORMAL LOW (ref 4.22–5.81)
RDW: 12.9 % (ref 11.5–15.5)
WBC: 19.1 10*3/uL — AB (ref 4.0–10.5)

## 2016-05-01 LAB — EXPECTORATED SPUTUM ASSESSMENT W REFEX TO RESP CULTURE

## 2016-05-01 LAB — EXPECTORATED SPUTUM ASSESSMENT W GRAM STAIN, RFLX TO RESP C

## 2016-05-01 MED ORDER — QUETIAPINE FUMARATE 100 MG PO TABS
100.0000 mg | ORAL_TABLET | Freq: Every day | ORAL | Status: DC
  Administered 2016-05-02 – 2016-05-06 (×4): 100 mg via ORAL
  Filled 2016-05-01 (×3): qty 1
  Filled 2016-05-01: qty 2

## 2016-05-01 MED ORDER — ENOXAPARIN SODIUM 30 MG/0.3ML ~~LOC~~ SOLN
30.0000 mg | Freq: Two times a day (BID) | SUBCUTANEOUS | Status: DC
  Administered 2016-05-01 – 2016-05-08 (×15): 30 mg via SUBCUTANEOUS
  Filled 2016-05-01 (×15): qty 0.3

## 2016-05-01 MED ORDER — ORAL CARE MOUTH RINSE
15.0000 mL | Freq: Two times a day (BID) | OROMUCOSAL | Status: DC
  Administered 2016-05-02 – 2016-05-08 (×9): 15 mL via OROMUCOSAL

## 2016-05-01 MED ORDER — MORPHINE SULFATE (PF) 2 MG/ML IV SOLN
2.0000 mg | INTRAVENOUS | Status: DC | PRN
  Administered 2016-05-01 – 2016-05-03 (×12): 4 mg via INTRAVENOUS
  Administered 2016-05-03 – 2016-05-04 (×2): 2 mg via INTRAVENOUS
  Administered 2016-05-04 – 2016-05-08 (×10): 4 mg via INTRAVENOUS
  Administered 2016-05-08: 2 mg via INTRAVENOUS
  Filled 2016-05-01: qty 2
  Filled 2016-05-01: qty 1
  Filled 2016-05-01 (×7): qty 2
  Filled 2016-05-01: qty 1
  Filled 2016-05-01 (×4): qty 2
  Filled 2016-05-01: qty 1
  Filled 2016-05-01 (×12): qty 2

## 2016-05-01 MED ORDER — MORPHINE SULFATE (PF) 2 MG/ML IV SOLN
2.0000 mg | INTRAVENOUS | Status: DC | PRN
  Administered 2016-05-01: 2 mg via INTRAVENOUS
  Filled 2016-05-01: qty 1

## 2016-05-01 MED ORDER — TRAMADOL HCL 50 MG PO TABS
50.0000 mg | ORAL_TABLET | Freq: Four times a day (QID) | ORAL | Status: DC | PRN
  Administered 2016-05-01 – 2016-05-06 (×7): 100 mg via ORAL
  Filled 2016-05-01 (×10): qty 2

## 2016-05-01 MED ORDER — CHLORHEXIDINE GLUCONATE 0.12 % MT SOLN
15.0000 mL | Freq: Two times a day (BID) | OROMUCOSAL | Status: DC
  Administered 2016-05-01 – 2016-05-08 (×13): 15 mL via OROMUCOSAL
  Filled 2016-05-01 (×14): qty 15

## 2016-05-01 NOTE — Progress Notes (Signed)
Patient found to have Coretrak tube out. Feedings stopped. Dr. Donell BeersByerly aware. Orders to have Coretrak team place new tube in the morning. Consult placed.

## 2016-05-01 NOTE — Progress Notes (Signed)
Inpatient Rehabilitation  I visited the patient at the bedside to initiate conversation regarding a possible IP Rehab admission when pt. Medically ready.  I left informational booklets for the patient and his family.  I will reach out to his family for further discussion.  Please call if questions.  Weldon PickingSusan Nehemiah Montee PT Inpatient Rehab Admissions Coordinator Cell [phone] Office [phone]

## 2016-05-01 NOTE — Care Management Note (Signed)
Case Management Note  Patient Details  Name: Bill [name] MRN: [id number] Date of Birth: 27-Jun-1981  Subjective/Objective:   Pt extubated on 04/27/16; has made good progress with therapies since extubation.  PT/OT recommending CIR admission.                   Action/Plan: IP Rehab consult in progress.  Will follow/assist with discharge planning as pt progresses.  Hopeful CIR admission pending medical stability.    Expected Discharge Date:                  Expected Discharge Plan:  IP Rehab Facility  In-House Referral:  Clinical Social Work  Discharge planning Services  CM Consult  Post Acute Care Choice:    Choice offered to:     DME Arranged:    DME Agency:     HH Arranged:    HH Agency:     Status of Service:  In process, will continue to follow  If discussed at Long Length of Stay Meetings, dates discussed:    Additional Comments:  Quintella BatonJulie W. Quinette Hentges, RN, BSN  Trauma/Neuro ICU Case Manager [phone]
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
## 2016-05-02 ENCOUNTER — Inpatient Hospital Stay (HOSPITAL_COMMUNITY): Payer: Self-pay

## 2016-05-02 LAB — URINE CULTURE

## 2016-05-02 LAB — GLUCOSE, CAPILLARY
GLUCOSE-CAPILLARY: 111 mg/dL — AB (ref 65–99)
Glucose-Capillary: 93 mg/dL (ref 65–99)

## 2016-05-02 MED ORDER — WHITE PETROLATUM GEL
Status: AC
  Administered 2016-05-02: 1
  Filled 2016-05-02: qty 1

## 2016-05-02 MED ORDER — DEXTROSE 5 % IV SOLN
2.0000 g | Freq: Two times a day (BID) | INTRAVENOUS | Status: DC
  Administered 2016-05-02 – 2016-05-03 (×4): 2 g via INTRAVENOUS
  Administered 2016-05-04: 23:00:00 via INTRAVENOUS
  Administered 2016-05-04 – 2016-05-06 (×5): 2 g via INTRAVENOUS
  Filled 2016-05-02 (×13): qty 2

## 2016-05-02 NOTE — Progress Notes (Signed)
I met with the patient and his mother and father at the bedside.  We discussed the IP Rehab program and purposes.  Pt's mother had read the informational booklets I left at the bedside yesterday. Pt. And family agree that pt. will need CIR. Pt. and family plan for pt. to travel back to [place], [place] once he is able to DC from the hospital/rehab.  I discussed case with Michael Jeffery, PA.  Pt. just had Cortrack reinserted with the hope of pt. gradually being able to tolerate diet advancement.  My co-worker Melissa Bowie will assume pt's case tomorrow in my absence.  We will follow along for medical readiness, activity tolerance and ongoing need for CIR.  Please call if questions.    PT Inpatient Rehab Admissions Coordinator Cell [phone] Office [phone]  

## 2016-05-02 NOTE — Significant Event (Signed)
Rapid Response Event Note Call per floor RN regarding Pt with acute onset chest pain. Advised to get EKG while RRT en route.   Overview: Time Called: 2047 Arrival Time: 2051 Event Type: Cardiac  Initial Focused Assessment: Upon my arrival Pt found resting in , appears anxious with significant other at bedside. RR 25-30 but does not appear to be in respiratory distress. Po2 95-99% on room air. Lung sounds course in bilateral lobes diminished in bilateral bases. Pt has very quiet raspy voice which he says is his normal this admission. Alert oriented x4. Complains of pain in right side 8/10. Points to JP site in right side, denies chest pain upon my arrival. JP site intact, dressing CDI, draining brown clear liquid. Midline ABD dressing CDI. Pt states "I've had pain like this everyday its just worse now"   Interventions: EKG completed with some slight ST changes looks similar to previous EKG on file. 4 MG Morphine given IVP for pain. Dr. Corliss Skainssuei paged and updated on Pt status and EKG per floor RN. Per MD hold transfer to 6 N tonight, Pt to remain in SDU for observation. Trauma MD to see in AM. Pt left resting in bed eyes closed.    Plan of Care (if not transferred): RN advised to monitor Pt closely tonight and update Provider and myself for worsening changes. RRT to follow tonight.   Event Summary: Name of Physician Notified: Trauma Service MD at 2115    at    Outcome: Stayed in room and stabalized, Other (Comment) (Pending transfer to 6 N canceled)  Event End Time: 2125  Lolita RiegerWhite, Hadlie Gipson Leigh Ahliyah Nienow
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
## 2016-05-02 NOTE — Progress Notes (Signed)
PHARMACY - PHYSICIAN COMMUNICATION CRITICAL VALUE ALERT - BLOOD CULTURE IDENTIFICATION (BCID)  Results for orders placed or performed during the hospital encounter of 04/10/16  Blood Culture ID Panel (Reflexed) (Collected: 04/19/2016 10:45 AM)  Result Value Ref Range   Enterococcus species NOT DETECTED NOT DETECTED   Listeria monocytogenes NOT DETECTED NOT DETECTED   Staphylococcus species DETECTED (A) NOT DETECTED   Staphylococcus aureus NOT DETECTED NOT DETECTED   Methicillin resistance DETECTED (A) NOT DETECTED   Streptococcus species NOT DETECTED NOT DETECTED   Streptococcus agalactiae NOT DETECTED NOT DETECTED   Streptococcus pneumoniae NOT DETECTED NOT DETECTED   Streptococcus pyogenes NOT DETECTED NOT DETECTED   Acinetobacter baumannii NOT DETECTED NOT DETECTED   Enterobacteriaceae species NOT DETECTED NOT DETECTED   Enterobacter cloacae complex NOT DETECTED NOT DETECTED   Escherichia coli NOT DETECTED NOT DETECTED   Klebsiella oxytoca NOT DETECTED NOT DETECTED   Klebsiella pneumoniae NOT DETECTED NOT DETECTED   Proteus species NOT DETECTED NOT DETECTED   Serratia marcescens NOT DETECTED NOT DETECTED   Haemophilus influenzae NOT DETECTED NOT DETECTED   Neisseria meningitidis NOT DETECTED NOT DETECTED   Pseudomonas aeruginosa NOT DETECTED NOT DETECTED   Candida albicans NOT DETECTED NOT DETECTED   Candida glabrata NOT DETECTED NOT DETECTED   Candida krusei NOT DETECTED NOT DETECTED   Candida parapsilosis NOT DETECTED NOT DETECTED   Candida tropicalis NOT DETECTED NOT DETECTED    Name of physician (or Provider) Contacted: Dr Lindie SpruceWyatt  Changes to prescribed antibiotics required: None - likely contaminant  Isaac BlissMichael Shawnte Demarest, PharmD, BCPS, [facility] LP[facility]CCCP Clinical Pharmacist Pager [phone] 05/02/2016 9:41 AM
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
## 2016-05-03 ENCOUNTER — Inpatient Hospital Stay (HOSPITAL_COMMUNITY): Payer: Self-pay

## 2016-05-03 LAB — BLOOD CULTURE ID PANEL (REFLEXED)
Acinetobacter baumannii: NOT DETECTED
CANDIDA ALBICANS: NOT DETECTED
CANDIDA TROPICALIS: NOT DETECTED
Candida glabrata: NOT DETECTED
Candida krusei: NOT DETECTED
Candida parapsilosis: NOT DETECTED
ENTEROBACTERIACEAE SPECIES: NOT DETECTED
Enterobacter cloacae complex: NOT DETECTED
Enterococcus species: NOT DETECTED
Escherichia coli: NOT DETECTED
HAEMOPHILUS INFLUENZAE: NOT DETECTED
KLEBSIELLA PNEUMONIAE: NOT DETECTED
Klebsiella oxytoca: NOT DETECTED
Listeria monocytogenes: NOT DETECTED
NEISSERIA MENINGITIDIS: NOT DETECTED
PROTEUS SPECIES: NOT DETECTED
Pseudomonas aeruginosa: NOT DETECTED
SERRATIA MARCESCENS: NOT DETECTED
STAPHYLOCOCCUS AUREUS BCID: NOT DETECTED
STAPHYLOCOCCUS SPECIES: NOT DETECTED
STREPTOCOCCUS AGALACTIAE: NOT DETECTED
STREPTOCOCCUS SPECIES: NOT DETECTED
Streptococcus pneumoniae: NOT DETECTED
Streptococcus pyogenes: NOT DETECTED

## 2016-05-03 LAB — CBC
HEMATOCRIT: 30.1 % — AB (ref 39.0–52.0)
HEMOGLOBIN: 9.7 g/dL — AB (ref 13.0–17.0)
MCH: 27.8 pg (ref 26.0–34.0)
MCHC: 32.2 g/dL (ref 30.0–36.0)
MCV: 86.2 fL (ref 78.0–100.0)
Platelets: 721 10*3/uL — ABNORMAL HIGH (ref 150–400)
RBC: 3.49 MIL/uL — AB (ref 4.22–5.81)
RDW: 13.4 % (ref 11.5–15.5)
WBC: 17.7 10*3/uL — ABNORMAL HIGH (ref 4.0–10.5)

## 2016-05-03 LAB — COMPREHENSIVE METABOLIC PANEL
ALBUMIN: 2.4 g/dL — AB (ref 3.5–5.0)
ALK PHOS: 135 U/L — AB (ref 38–126)
ALT: 34 U/L (ref 17–63)
ANION GAP: 8 (ref 5–15)
AST: 34 U/L (ref 15–41)
BILIRUBIN TOTAL: 1.4 mg/dL — AB (ref 0.3–1.2)
CALCIUM: 8.4 mg/dL — AB (ref 8.9–10.3)
CO2: 25 mmol/L (ref 22–32)
CREATININE: 0.84 mg/dL (ref 0.61–1.24)
Chloride: 104 mmol/L (ref 101–111)
GFR calc Af Amer: >60 mL/min (ref >60)
GFR calc non Af Amer: >60 mL/min (ref >60)
GLUCOSE: 92 mg/dL (ref 65–99)
Potassium: 3.8 mmol/L (ref 3.5–5.1)
Sodium: 137 mmol/L (ref 135–145)
TOTAL PROTEIN: 7 g/dL (ref 6.5–8.1)

## 2016-05-03 LAB — CULTURE, BLOOD (ROUTINE X 2)

## 2016-05-03 MED ORDER — HYDROMORPHONE HCL 1 MG/ML IJ SOLN
1.0000 mg | INTRAMUSCULAR | Status: DC | PRN
  Administered 2016-05-03 – 2016-05-08 (×21): 1 mg via INTRAVENOUS
  Filled 2016-05-03 (×26): qty 1

## 2016-05-03 NOTE — Progress Notes (Signed)
Inpatient Rehabilitation  Note over night events and today's pain; as a result, did not visit patient or family today.  Plan to follow up with them tomorrow for discussion regarding timing of IP Rehab.  Please call with questions.   Charlane FerrettiMelissa Alina Gilkey, M.A., CCC/SLP Admission Coordinator  [facility]  Cell [phone]
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
## 2016-05-03 NOTE — Progress Notes (Signed)
PHARMACY - PHYSICIAN COMMUNICATION CRITICAL VALUE ALERT - BLOOD CULTURE IDENTIFICATION (BCID)  Results for orders placed or performed during the hospital encounter of 04/10/16  Blood Culture ID Panel (Reflexed) (Collected: 05/01/2016  2:25 PM)  Result Value Ref Range   Enterococcus species NOT DETECTED NOT DETECTED   Listeria monocytogenes NOT DETECTED NOT DETECTED   Staphylococcus species NOT DETECTED NOT DETECTED   Staphylococcus aureus NOT DETECTED NOT DETECTED   Streptococcus species NOT DETECTED NOT DETECTED   Streptococcus agalactiae NOT DETECTED NOT DETECTED   Streptococcus pneumoniae NOT DETECTED NOT DETECTED   Streptococcus pyogenes NOT DETECTED NOT DETECTED   Acinetobacter baumannii NOT DETECTED NOT DETECTED   Enterobacteriaceae species NOT DETECTED NOT DETECTED   Enterobacter cloacae complex NOT DETECTED NOT DETECTED   Escherichia coli NOT DETECTED NOT DETECTED   Klebsiella oxytoca NOT DETECTED NOT DETECTED   Klebsiella pneumoniae NOT DETECTED NOT DETECTED   Proteus species NOT DETECTED NOT DETECTED   Serratia marcescens NOT DETECTED NOT DETECTED   Haemophilus influenzae NOT DETECTED NOT DETECTED   Neisseria meningitidis NOT DETECTED NOT DETECTED   Pseudomonas aeruginosa NOT DETECTED NOT DETECTED   Candida albicans NOT DETECTED NOT DETECTED   Candida glabrata NOT DETECTED NOT DETECTED   Candida krusei NOT DETECTED NOT DETECTED   Candida parapsilosis NOT DETECTED NOT DETECTED   Candida tropicalis NOT DETECTED NOT DETECTED    Name of physician (or Provider) Contacted: Charma IgoMichael Jeffery  Changes to prescribed antibiotics required: No changes needed, likely contaminant, GPR on gram stain  Elwin Sleightowell, Yonna Alwin Kay 05/03/2016  3:50 PM
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
## 2016-05-03 NOTE — Progress Notes (Signed)
Contacted doctor this morning, to inform that patient is still in distress, patting his stomach and mouth breathing. Doctor order additional labs and portable x-ray, states he will follow-up with the oncoming doctor this morning about patient distress.
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
## 2016-05-03 NOTE — Progress Notes (Addendum)
SLP Cancellation Note  Patient Details Name: Bill [name] XXXWatkins MRN: [id number] DOB: 07-08-1981   Cancelled treatment:       Reason Eval/Treat Not Completed: Medical issues which prohibited therapy. Upon arrival to room, pt with NT present and noted to be in visible pain, pointing to his abdomen. Pt reports his pain to be an "8 or 10" out of 10. He requested pain medication (RN made aware) and requested SLP to return at another time. Pt's voice remains aphonic. Will f/u at another time.   Maxcine Hamaiewonsky, Hikaru Delorenzo 05/03/2016, 11:30 AM  Maxcine HamLaura Paiewonsky, M.A. CCC-SLP [phone]
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
## 2016-05-04 LAB — CULTURE, BLOOD (ROUTINE X 2)

## 2016-05-04 LAB — CULTURE, RESPIRATORY W GRAM STAIN

## 2016-05-04 LAB — CULTURE, RESPIRATORY: CULTURE: NORMAL

## 2016-05-04 MED ORDER — HYDRALAZINE HCL 20 MG/ML IJ SOLN
20.0000 mg | Freq: Four times a day (QID) | INTRAMUSCULAR | Status: DC | PRN
  Administered 2016-05-04 – 2016-05-08 (×4): 20 mg via INTRAVENOUS
  Filled 2016-05-04 (×4): qty 1

## 2016-05-04 MED ORDER — METOPROLOL TARTRATE 5 MG/5ML IV SOLN
10.0000 mg | Freq: Four times a day (QID) | INTRAVENOUS | Status: DC
  Administered 2016-05-04 – 2016-05-08 (×15): 10 mg via INTRAVENOUS
  Filled 2016-05-04 (×15): qty 10

## 2016-05-04 NOTE — Progress Notes (Signed)
OT NOTE  Mother  Bill [name](Bill [name]) present this session and asking to speak with SW regarding Medicaid paperwork. Parents are here from [place] and plan to help patient d/c back to [place] with them.  Bill MccreedyBarbara cell number [phone]  Mother was also on the phone with parole officer explaining patients current hospitalization. Mother provided parole officer detective phone number on business card located in the room.   [name], [name]   OTR/L Pager: [phone] Office: [phone] .
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
## 2016-05-04 NOTE — Plan of Care (Signed)
Problem: Health Behavior/Discharge Planning: Goal: Ability to manage health-related needs will improve Outcome: Progressing CM, SW, and CIR all following patient.   Problem: Pain Managment: Goal: General experience of comfort will improve Outcome: Progressing Pain levels remain high, new prn meds added last night.   Problem: Physical Regulation: Goal: Ability to maintain clinical measurements within normal limits will improve Outcome: Progressing BP remains elevated, new prn meds added today.   Problem: Activity: Goal: Risk for activity intolerance will decrease Outcome: Progressing Patient ambulated in hall with PT/OT today.   Problem: Nutrition: Goal: Adequate nutrition will be maintained Outcome: Not Progressing Currently NPO, off tube feeds, and no orders for TNA at this time.

## 2016-05-04 NOTE — Progress Notes (Signed)
Patient transferred to [phone] without difficulty. Will call patient's mother to inform her of room change.   Catori Panozzo Eileen,RN.

## 2016-05-04 NOTE — Progress Notes (Addendum)
Received pt via bed. Awake, c/o abd pain. Abd dressing dry and intact.  NPO maint. Telemetry box #11 on. 1650 HR 126 irregular. Metoprolol 5mg  IV given as ordered prn. At 1730 HR 102. Dr Derrell Lollingamirez notified, will hold the 1800 dose of Metoprolol.
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
## 2016-05-05 MED ORDER — POTASSIUM CHLORIDE IN NACL 20-0.45 MEQ/L-% IV SOLN
INTRAVENOUS | Status: DC
  Administered 2016-05-05 – 2016-05-08 (×9): via INTRAVENOUS
  Filled 2016-05-05 (×11): qty 1000
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
## 2016-05-06 LAB — BASIC METABOLIC PANEL
ANION GAP: 9 (ref 5–15)
BUN: 5 mg/dL — ABNORMAL LOW (ref 6–20)
CHLORIDE: 102 mmol/L (ref 101–111)
CO2: 23 mmol/L (ref 22–32)
Calcium: 8.3 mg/dL — ABNORMAL LOW (ref 8.9–10.3)
Creatinine, Ser: 0.72 mg/dL (ref 0.61–1.24)
GFR calc Af Amer: >60 mL/min (ref >60)
GLUCOSE: 89 mg/dL (ref 65–99)
POTASSIUM: 3.9 mmol/L (ref 3.5–5.1)
Sodium: 134 mmol/L — ABNORMAL LOW (ref 135–145)

## 2016-05-06 LAB — CBC
HEMATOCRIT: 28.5 % — AB (ref 39.0–52.0)
HEMOGLOBIN: 9.2 g/dL — AB (ref 13.0–17.0)
MCH: 27.7 pg (ref 26.0–34.0)
MCHC: 32.3 g/dL (ref 30.0–36.0)
MCV: 85.8 fL (ref 78.0–100.0)
PLATELETS: 564 10*3/uL — AB (ref 150–400)
RBC: 3.32 MIL/uL — AB (ref 4.22–5.81)
RDW: 13.7 % (ref 11.5–15.5)
WBC: 14.7 10*3/uL — AB (ref 4.0–10.5)
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
## 2016-05-07 ENCOUNTER — Inpatient Hospital Stay (HOSPITAL_COMMUNITY): Payer: Self-pay

## 2016-05-07 LAB — BASIC METABOLIC PANEL
ANION GAP: 9 (ref 5–15)
BUN: <5 mg/dL — ABNORMAL LOW (ref 6–20)
CHLORIDE: 100 mmol/L — AB (ref 101–111)
CO2: 25 mmol/L (ref 22–32)
Calcium: 8.7 mg/dL — ABNORMAL LOW (ref 8.9–10.3)
Creatinine, Ser: 0.8 mg/dL (ref 0.61–1.24)
GFR calc Af Amer: >60 mL/min (ref >60)
GLUCOSE: 110 mg/dL — AB (ref 65–99)
POTASSIUM: 4.6 mmol/L (ref 3.5–5.1)
Sodium: 134 mmol/L — ABNORMAL LOW (ref 135–145)

## 2016-05-07 MED ORDER — METOPROLOL TARTRATE 5 MG/5ML IV SOLN
5.0000 mg | Freq: Once | INTRAVENOUS | Status: AC
  Administered 2016-05-07: 5 mg via INTRAVENOUS

## 2016-05-07 MED ORDER — ENSURE ENLIVE PO LIQD
237.0000 mL | Freq: Three times a day (TID) | ORAL | Status: DC
  Administered 2016-05-08 (×2): 237 mL via ORAL
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
## 2016-05-07 NOTE — Progress Notes (Signed)
Inpatient Rehabilitation  I note pt. has passed his swallow assessment and his diet will be advanced.  Will monitor for diet tolerance today and will assess for possible admission to CIR tomorrow.  Please call if questions.  Weldon PickingSusan Lacye Mccarn PT Inpatient Rehab Admissions Coordinator Cell [phone] Office [phone]

## 2016-05-07 NOTE — Progress Notes (Signed)
Modified Barium Swallow Progress Note  Patient Details  Name: Bill [name] XXXWatkins MRN: [id number] Date of Birth: 08/03/1980  Today's Date: 05/07/2016  Modified Barium Swallow completed.  Full report located under Chart Review in the Imaging Section.  Brief recommendations include the following:  Clinical Impression Pt demosntrates very mild sensory deficits following prolonged intubation only evident when pushed to talk large consecutive sips. Pt silently aspirated before the swallow x1 and then when cued to cough had laryngopharyngeal reflux. Otherwise with cup sips of thin liquids over many trials timing of the swallow and airway protection was adequate. Esophageal sweep with pills and thin appeared WNL. Pt may intiaite a regular diet and thin liquids with basic precautions. Will f/u for tolerance.    Swallow Evaluation Recommendations       SLP Diet Recommendations: Regular solids;Thin liquid   Liquid Administration via: Cup;No straw   Medication Administration: Whole meds with liquid   Supervision: Patient able to self feed   Compensations: Slow rate;Small sips/bites   Postural Changes: Remain semi-upright after after feeds/meals (Comment)   Oral Care Recommendations: Oral care BID       Harlon DittyBonnie Drako Maese, MA CCC-SLP [phone]  Odelle Kosier, Riley NearingBonnie Caroline 05/07/2016,2:14 PM
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
## 2016-05-08 ENCOUNTER — Encounter (HOSPITAL_COMMUNITY): Payer: Self-pay | Admitting: Nurse Practitioner

## 2016-05-08 ENCOUNTER — Inpatient Hospital Stay (HOSPITAL_COMMUNITY)
Admission: RE | Admit: 2016-05-08 | Discharge: 2016-05-12 | DRG: 947 | Disposition: A | Discharge status: Home / Self Care | Payer: Self-pay | Source: Intra-hospital | Attending: Physical Medicine & Rehabilitation | Admitting: Physical Medicine & Rehabilitation

## 2016-05-08 ENCOUNTER — Encounter (HOSPITAL_COMMUNITY): Payer: Self-pay | Admitting: Emergency Medicine

## 2016-05-08 DIAGNOSIS — D62 Acute posthemorrhagic anemia: Secondary | ICD-10-CM | POA: Diagnosis present

## 2016-05-08 DIAGNOSIS — D72829 Elevated white blood cell count, unspecified: Secondary | ICD-10-CM | POA: Diagnosis present

## 2016-05-08 DIAGNOSIS — G8918 Other acute postprocedural pain: Secondary | ICD-10-CM

## 2016-05-08 DIAGNOSIS — S36113A Laceration of liver, unspecified degree, initial encounter: Secondary | ICD-10-CM

## 2016-05-08 DIAGNOSIS — S299XXS Unspecified injury of thorax, sequela: Secondary | ICD-10-CM

## 2016-05-08 DIAGNOSIS — F4321 Adjustment disorder with depressed mood: Secondary | ICD-10-CM

## 2016-05-08 DIAGNOSIS — I158 Other secondary hypertension: Secondary | ICD-10-CM

## 2016-05-08 DIAGNOSIS — G9340 Encephalopathy, unspecified: Secondary | ICD-10-CM | POA: Diagnosis present

## 2016-05-08 DIAGNOSIS — W3400XD Accidental discharge from unspecified firearms or gun, subsequent encounter: Secondary | ICD-10-CM

## 2016-05-08 DIAGNOSIS — S36113D Laceration of liver, unspecified degree, subsequent encounter: Secondary | ICD-10-CM

## 2016-05-08 DIAGNOSIS — R1310 Dysphagia, unspecified: Secondary | ICD-10-CM | POA: Diagnosis present

## 2016-05-08 DIAGNOSIS — T8130XA Disruption of wound, unspecified, initial encounter: Secondary | ICD-10-CM

## 2016-05-08 DIAGNOSIS — I82432 Acute embolism and thrombosis of left popliteal vein: Secondary | ICD-10-CM

## 2016-05-08 DIAGNOSIS — D72820 Lymphocytosis (symptomatic): Secondary | ICD-10-CM

## 2016-05-08 DIAGNOSIS — R5381 Other malaise: Principal | ICD-10-CM

## 2016-05-08 DIAGNOSIS — S299XXA Unspecified injury of thorax, initial encounter: Secondary | ICD-10-CM | POA: Diagnosis present

## 2016-05-08 DIAGNOSIS — I10 Essential (primary) hypertension: Secondary | ICD-10-CM | POA: Diagnosis present

## 2016-05-08 DIAGNOSIS — F4311 Post-traumatic stress disorder, acute: Secondary | ICD-10-CM

## 2016-05-08 DIAGNOSIS — W3400XA Accidental discharge from unspecified firearms or gun, initial encounter: Secondary | ICD-10-CM

## 2016-05-08 LAB — CBC
HCT: 36.3 % — ABNORMAL LOW (ref 39.0–52.0)
HEMOGLOBIN: 11.8 g/dL — AB (ref 13.0–17.0)
MCH: 28.1 pg (ref 26.0–34.0)
MCHC: 32.5 g/dL (ref 30.0–36.0)
MCV: 86.4 fL (ref 78.0–100.0)
Platelets: 648 10*3/uL — ABNORMAL HIGH (ref 150–400)
RBC: 4.2 MIL/uL — AB (ref 4.22–5.81)
RDW: 13.6 % (ref 11.5–15.5)
WBC: 15.5 10*3/uL — ABNORMAL HIGH (ref 4.0–10.5)

## 2016-05-08 LAB — CREATININE, SERUM
CREATININE: 0.69 mg/dL (ref 0.61–1.24)
GFR calc Af Amer: >60 mL/min (ref >60)
GFR calc non Af Amer: >60 mL/min (ref >60)

## 2016-05-08 MED ORDER — ENOXAPARIN SODIUM 30 MG/0.3ML ~~LOC~~ SOLN
30.0000 mg | Freq: Two times a day (BID) | SUBCUTANEOUS | Status: DC
  Administered 2016-05-08 – 2016-05-09 (×2): 30 mg via SUBCUTANEOUS
  Filled 2016-05-08 (×2): qty 0.3

## 2016-05-08 MED ORDER — BISACODYL 10 MG RE SUPP
10.0000 mg | Freq: Every day | RECTAL | Status: DC | PRN
  Administered 2016-05-11: 10 mg via RECTAL
  Filled 2016-05-08: qty 1

## 2016-05-08 MED ORDER — METOPROLOL TARTRATE 50 MG PO TABS
50.0000 mg | ORAL_TABLET | Freq: Two times a day (BID) | ORAL | Status: DC
  Administered 2016-05-08 – 2016-05-12 (×8): 50 mg via ORAL
  Filled 2016-05-08 (×8): qty 1

## 2016-05-08 MED ORDER — OXYCODONE HCL 5 MG PO TABS
5.0000 mg | ORAL_TABLET | ORAL | Status: DC | PRN
  Administered 2016-05-08: 10 mg via ORAL
  Filled 2016-05-08: qty 2

## 2016-05-08 MED ORDER — DOCUSATE SODIUM 100 MG PO CAPS
100.0000 mg | ORAL_CAPSULE | Freq: Two times a day (BID) | ORAL | Status: DC
  Administered 2016-05-08: 100 mg via ORAL
  Filled 2016-05-08: qty 1

## 2016-05-08 MED ORDER — POLYETHYLENE GLYCOL 3350 17 G PO PACK
17.0000 g | PACK | Freq: Every day | ORAL | Status: DC
  Administered 2016-05-08: 17 g via ORAL
  Filled 2016-05-08: qty 1

## 2016-05-08 MED ORDER — OXYCODONE HCL 5 MG PO TABS
5.0000 mg | ORAL_TABLET | ORAL | Status: DC | PRN
  Administered 2016-05-08 (×3): 15 mg via ORAL
  Administered 2016-05-09: 5 mg via ORAL
  Administered 2016-05-09 (×2): 15 mg via ORAL
  Administered 2016-05-09: 10 mg via ORAL
  Administered 2016-05-09 – 2016-05-12 (×11): 15 mg via ORAL
  Filled 2016-05-08: qty 3
  Filled 2016-05-08: qty 2
  Filled 2016-05-08: qty 3
  Filled 2016-05-08: qty 1
  Filled 2016-05-08 (×14): qty 3

## 2016-05-08 MED ORDER — DOCUSATE SODIUM 100 MG PO CAPS
100.0000 mg | ORAL_CAPSULE | Freq: Two times a day (BID) | ORAL | Status: DC
  Administered 2016-05-09 – 2016-05-12 (×5): 100 mg via ORAL
  Filled 2016-05-08 (×8): qty 1

## 2016-05-08 MED ORDER — ENSURE ENLIVE PO LIQD
237.0000 mL | Freq: Three times a day (TID) | ORAL | Status: DC
  Administered 2016-05-09 – 2016-05-11 (×6): 237 mL via ORAL

## 2016-05-08 MED ORDER — HYDROMORPHONE HCL 2 MG/ML IJ SOLN
0.5000 mg | INTRAMUSCULAR | Status: DC | PRN
  Administered 2016-05-08: 0.5 mg via INTRAVENOUS
  Filled 2016-05-08: qty 1

## 2016-05-08 MED ORDER — PNEUMOCOCCAL VAC POLYVALENT 25 MCG/0.5ML IJ INJ
0.5000 mL | INJECTION | INTRAMUSCULAR | Status: AC
  Administered 2016-05-09: 0.5 mL via INTRAMUSCULAR
  Filled 2016-05-08: qty 0.5

## 2016-05-08 MED ORDER — HYDROMORPHONE HCL 1 MG/ML IJ SOLN: 0.5000 mg | INTRAMUSCULAR | Status: DC | PRN

## 2016-05-08 MED ORDER — POLYETHYLENE GLYCOL 3350 17 G PO PACK
17.0000 g | PACK | Freq: Every day | ORAL | Status: DC
  Administered 2016-05-10 – 2016-05-12 (×3): 17 g via ORAL
  Filled 2016-05-08 (×5): qty 1

## 2016-05-08 MED ORDER — ENOXAPARIN SODIUM 30 MG/0.3ML ~~LOC~~ SOLN: 30.0000 mg | Freq: Two times a day (BID) | SUBCUTANEOUS | Status: DC
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
## 2016-05-08 NOTE — Progress Notes (Signed)
Pt discharged to inpatient rehab this pm. Condition stable. Report called and given to receiving unit nurse
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
## 2016-05-08 NOTE — Progress Notes (Signed)
Patient ID: Bill [name], male   DOB: Sep 18, 1980, 35 y.o.   MRN: [id number] Patient admitted to [id number] via bed, escorted by nursing staff.  Patient verbalized understanding of rehab process, signed fall safety agreement.  Appears to be in no immediate distress at this time.  Bill [name], Bill Sokol J, RN
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
## 2016-05-08 NOTE — Progress Notes (Signed)
Inpatient Rehabilitation  Pt. Appears to be tolerating his diet.  I have discussed case with Charma IgoMichael Jeffery, trauma PA.  He gives medical clearance for pt. To admit to CIR today. I discussed the plan with the patient and with his mother over speaker phone (she is back in [place] currently).  They are both in agreement and are pleased that he is one step closer to being able to get back home.  I have updated Sidney AceJulie Amerson, RNCM and Macario GoldsJesse Scinto, CSW as well as pt's Chief Executive OfficerN Ben.  Please call if questions.  Weldon PickingSusan Keaten Mashek PT Inpatient Rehab Admissions Coordinator Cell [phone] Office [phone]
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
## 2016-05-08 NOTE — Progress Notes (Signed)
Received a call from pt's mom stating that she tried to call inpatient rehab and speak to her son but was unable to do so. Informed her that we will contact inpatient rehab and take care of the issue for her. Contacted inpatient rehab and had the matter resolved
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
## 2016-05-09 ENCOUNTER — Inpatient Hospital Stay (HOSPITAL_COMMUNITY): Payer: Self-pay

## 2016-05-09 ENCOUNTER — Inpatient Hospital Stay (HOSPITAL_COMMUNITY): Payer: Self-pay | Admitting: Occupational Therapy

## 2016-05-09 ENCOUNTER — Inpatient Hospital Stay (HOSPITAL_COMMUNITY): Payer: Self-pay | Admitting: Physical Therapy

## 2016-05-09 ENCOUNTER — Inpatient Hospital Stay (HOSPITAL_COMMUNITY): Payer: Self-pay | Admitting: Speech Pathology

## 2016-05-09 DIAGNOSIS — G8918 Other acute postprocedural pain: Secondary | ICD-10-CM

## 2016-05-09 DIAGNOSIS — M7989 Other specified soft tissue disorders: Secondary | ICD-10-CM

## 2016-05-09 DIAGNOSIS — D62 Acute posthemorrhagic anemia: Secondary | ICD-10-CM

## 2016-05-09 DIAGNOSIS — I82432 Acute embolism and thrombosis of left popliteal vein: Secondary | ICD-10-CM

## 2016-05-09 DIAGNOSIS — D72820 Lymphocytosis (symptomatic): Secondary | ICD-10-CM

## 2016-05-09 DIAGNOSIS — I158 Other secondary hypertension: Secondary | ICD-10-CM

## 2016-05-09 LAB — COMPREHENSIVE METABOLIC PANEL
ALBUMIN: 2.8 g/dL — AB (ref 3.5–5.0)
ALK PHOS: 119 U/L (ref 38–126)
ALT: 21 U/L (ref 17–63)
ANION GAP: 8 (ref 5–15)
AST: 26 U/L (ref 15–41)
BUN: <5 mg/dL — ABNORMAL LOW (ref 6–20)
CALCIUM: 9.1 mg/dL (ref 8.9–10.3)
CO2: 27 mmol/L (ref 22–32)
CREATININE: 0.76 mg/dL (ref 0.61–1.24)
Chloride: 101 mmol/L (ref 101–111)
GFR calc Af Amer: >60 mL/min (ref >60)
GFR calc non Af Amer: >60 mL/min (ref >60)
GLUCOSE: 130 mg/dL — AB (ref 65–99)
Potassium: 3.9 mmol/L (ref 3.5–5.1)
SODIUM: 136 mmol/L (ref 135–145)
Total Bilirubin: 0.7 mg/dL (ref 0.3–1.2)
Total Protein: 8 g/dL (ref 6.5–8.1)

## 2016-05-09 LAB — CBC WITH DIFFERENTIAL/PLATELET
BASOS ABS: 0 10*3/uL (ref 0.0–0.1)
BASOS PCT: 0 %
EOS ABS: 0.2 10*3/uL (ref 0.0–0.7)
Eosinophils Relative: 2 %
HCT: 34.3 % — ABNORMAL LOW (ref 39.0–52.0)
HEMOGLOBIN: 11.1 g/dL — AB (ref 13.0–17.0)
Lymphocytes Relative: 20 %
Lymphs Abs: 2.6 10*3/uL (ref 0.7–4.0)
MCH: 28 pg (ref 26.0–34.0)
MCHC: 32.4 g/dL (ref 30.0–36.0)
MCV: 86.4 fL (ref 78.0–100.0)
Monocytes Absolute: 1.5 10*3/uL — ABNORMAL HIGH (ref 0.1–1.0)
Monocytes Relative: 11 %
NEUTROS PCT: 67 %
Neutro Abs: 8.9 10*3/uL — ABNORMAL HIGH (ref 1.7–7.7)
Platelets: 602 10*3/uL — ABNORMAL HIGH (ref 150–400)
RBC: 3.97 MIL/uL — AB (ref 4.22–5.81)
RDW: 13.6 % (ref 11.5–15.5)
WBC: 13.3 10*3/uL — AB (ref 4.0–10.5)

## 2016-05-09 MED ORDER — RIVAROXABAN 20 MG PO TABS: 20.0000 mg | ORAL_TABLET | Freq: Every day | ORAL | Status: DC

## 2016-05-09 MED ORDER — RIVAROXABAN 15 MG PO TABS
15.0000 mg | ORAL_TABLET | Freq: Two times a day (BID) | ORAL | Status: DC
  Administered 2016-05-09 – 2016-05-12 (×6): 15 mg via ORAL
  Filled 2016-05-09 (×6): qty 1
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
## 2016-05-09 NOTE — Care Management Note (Signed)
[facility] Individual Statement of Services  Patient Name:  Bill [name]  Date:  05/09/2016  Welcome to the [facility].  Our goal is to provide you with an individualized program based on your diagnosis and situation, designed to meet your specific needs.  With this comprehensive rehabilitation program, you will be expected to participate in at least 3 hours of rehabilitation therapies Monday-Friday, with modified therapy programming on the weekends.  Your rehabilitation program will include the following services:  Physical Therapy (PT), Occupational Therapy (OT), Speech Therapy (ST), 24 hour per day rehabilitation nursing, Neuropsychology, Case Management (Social Worker), Rehabilitation Medicine, Nutrition Services and Pharmacy Services  Weekly team conferences will be held on Wednesday to discuss your progress.  Your Social Worker will talk with you frequently to get your input and to update you on team discussions.  Team conferences with you and your family in attendance may also be held.  Expected length of stay: 3-5 days Overall anticipated outcome: mod/i level  Depending on your progress and recovery, your program may change. Your Social Worker will coordinate services and will keep you informed of any changes. Your Social Worker's name and contact numbers are listed  below.  The following services may also be recommended but are not provided by the [facility]:   Driving Evaluations  Home Health Rehabiltiation Services  Outpatient Rehabilitation Services  Vocational Rehabilitation   Arrangements will be made to provide these services after discharge if needed.  Arrangements include referral to agencies that provide these services.  Your insurance has been verified to be:  self pay-medicaid pending Your primary doctor is:  Chales AbrahamsMary Ann Placey-NP  Pertinent information will be shared with your doctor and your  insurance company.  Social Worker:  Dossie DerBecky Ferdie Bakken, SW [phone] or ([phone]) [phone]  Information discussed with and copy given to patient by: Lucy Chrisupree, Shany Marinez G, 05/09/2016, 4:00 PM

## 2016-05-09 NOTE — Plan of Care (Signed)
Problem: Consults Goal: Nutrition Consult-if indicated Outcome: Progressing Patient able to verbalize need to eat protein and remain hydrated
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
## 2016-05-09 NOTE — Progress Notes (Signed)
*  PRELIMINARY RESULTS* Vascular Ultrasound Lower extremity venous duplex has been completed.  Preliminary findings: DVT noted in the right gastroc, left gastroc, and left popliteal veins.   [place]ed results to [place], RN   Farrel DemarkJill Eunice, RDMS, RVT  05/09/2016, 2:13 PM
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
## 2016-05-09 NOTE — Progress Notes (Signed)
Patient information reviewed and entered into eRehab system by Ernie Kasler, RN, CRRN, PPS Coordinator.  Information including medical coding and functional independence measure will be reviewed and updated through discharge.    
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
## 2016-05-10 ENCOUNTER — Inpatient Hospital Stay (HOSPITAL_COMMUNITY): Payer: Self-pay | Admitting: Occupational Therapy

## 2016-05-10 ENCOUNTER — Inpatient Hospital Stay (HOSPITAL_COMMUNITY): Payer: Self-pay | Admitting: Speech Pathology

## 2016-05-10 ENCOUNTER — Inpatient Hospital Stay (HOSPITAL_COMMUNITY): Payer: Self-pay | Admitting: Physical Therapy

## 2016-05-10 ENCOUNTER — Encounter (HOSPITAL_COMMUNITY): Payer: Self-pay

## 2016-05-10 DIAGNOSIS — F4311 Post-traumatic stress disorder, acute: Secondary | ICD-10-CM

## 2016-05-10 DIAGNOSIS — F4321 Adjustment disorder with depressed mood: Secondary | ICD-10-CM

## 2016-05-10 NOTE — Progress Notes (Signed)
Physical Therapy Session Note  Patient Details  Name: Bill [name] Xxxwatkins MRN: [id number] Date of Birth: 1980/12/25  Today's Date: 05/10/2016 PT Individual Time: 1300-1415 PT Individual Time Calculation (min): 75 min    Short Term Goals: Week 1:  PT Short Term Goal 1 (Week 1): = LTGs due to anticipated LOS  Skilled Therapeutic Interventions/Progress Updates:    Patient sitting in recliner upon arrival. Session focused on functional ambulation in controlled environment and on carpeted surfaces throughout rehab unit up to 1000 ft without AD with mod I, functional tasks of carrying bin with BUE while ambulating, and dynamic standing balance and activity tolerance during bowling task including setup of pins and retrieving bowling ball. Patient ambulated in room to retrieve dirty laundry and carry bag to laundry room but laundry occupied. In gym, instructed in seated BUE therex with lightweight dumb bells to tolerance due to abdominal/chest pain for bicep curls, hammer presses, and tricep presses but unable to complete overhead or chest presses due to pain. Patient correctly recalled time for next pain medication and requested medication from RN. Performed NuStep using BLE only at level 2 x 5 min for strengthening and endurance. Patient made mod I in controlled environment of room, left sitting in recliner with all needs within reach.    Outcome measure completed: 10 MWT without device = 0.83 m/s   Therapy Documentation Precautions:  Precautions Precautions: Fall Precaution Comments: abdominal incision precautions Restrictions Weight Bearing Restrictions: No Pain: Pain Assessment Pain Assessment: 0-10 Pain Score: 9  Pain Type: Acute pain Pain Location: Abdomen Pain Orientation: Mid Pain Descriptors / Indicators: Aching Pain Frequency: Constant Pain Onset: On-going Patients Stated Pain Goal: 4 Pain Intervention(s): Medication (See eMAR)  See Function Navigator for Current Functional  Status.   Therapy/Group: Individual Therapy  Kerney ElbeVarner, Jozelynn Danielson A 05/10/2016, 2:11 PM
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
## 2016-05-10 NOTE — Progress Notes (Signed)
Speech Language Pathology Discharge Summary  Patient Details  Name: Bill [name] MRN: [id number] Date of Birth: [birth year]    Patient has met  1 of  1 long term goals.  Patient to discharge at overall Modified Independent level.  Reasons goals not met:   n/a  Skilled Intervention: Pt able to demonstrate proper use of EMST device at mod I. Discussed HEP. No further services indicated.   Clinical Impression/Discharge Summary:   Pt has made good gains while inpatient and is discharging having met 1 out of 1 long term goals.  Pt is overall mod I for intelligibility at the conversational level due to mildly decreased intelligibility resulting from decreased vocal intensity s/p prolonged intubation.  Pt was initiated on a respiratory muscle strength training program while inpatient to address increased respiratory drive for improved vocal intensity to achieve better intelligibility in conversations.  He is consistently tolerating repetitions on expiratory muscle strengthening device at 40 cm H2O with an effort level of <7/10.  Pt education is complete at this time. P t is discharging home with 24/7 supervision from family at discharge.  No further ST needs indicated post discharge.    Care Partner:  Caregiver Able to Provide Assistance:  (n/a)  Type of Caregiver Assistance:  (n/a)  Recommendation:  None      Equipment: EMST device, provided to pt by therapist    Reasons for discharge: Discharged from hospital   Patient/Family Agrees with Progress Made and Goals Achieved: Yes   Function:  Eating Eating   Modified Consistency Diet: No             Cognition Comprehension Comprehension assist level: Follows complex conversation/direction with no assist  Expression   Expression assist level: Expresses basic needs/ideas: With no assist  Social Interaction Social Interaction assist level: Interacts appropriately with others with medication or extra time (anti-anxiety,  antidepressant).  Problem Solving Problem solving assist level: Solves basic problems with no assist  Memory Memory assist level: More than reasonable amount of time   Emilio Math 05/10/2016, 5:33 PM   Weldon Inches MA, CCC-SLP 05/11/2016
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
## 2016-05-10 NOTE — Progress Notes (Signed)
Social Work Patient ID: Geologist, engineeringmmitt Xxxwatkins, male   DOB: Feb 25, 1981, 35 y.o.   MRN: [id number]  Spoke with MD who feels pt will be medically ready for discharge Sat. Have contacted his parents to inform them of this and His Dad will plan to leave tomorrow night to get here Sat, it will take him 12 hours by car. Will make sure medications and follow up is arranged. Mom asked if pt would be considered disability informed MD will monitor his Progress. Made aware she will need to apply for Medicaid in [place] also. She plans too.

## 2016-05-10 NOTE — Progress Notes (Signed)
Occupational Therapy Session Note  Patient Details  Name: Bill [name] MRN: [id number] Date of Birth: 30-Jun-1981  Today's Date: 05/10/2016 OT Individual Time: 1100-1200 OT Individual Time Calculation (min): 60 min   Short Term Goals: Week 1:  OT Short Term Goal 1 (Week 1): LTG=STG 2/2 estimated short LOS  Skilled Therapeutic Interventions/Progress Updates:   1:1 OT session focused on dynamic standing balance, activity tolerance, and LB dressing. Pt asleep in bed upon OT arrival, easily awoken, and eager to participate in [place]. Pt reports he already completed B&D tasks this AM with his g/f's assistance. Pt reported needing help only with LB dressing. OT instructed pt on LB dressing techniques and utilized ADL AE for pt to complete LB dressing with set-up A only. Pt then ambulated with supervision and no AD to therapy gym the patient participated in dynamic standing activities, incorporating weight shifting, standing on uneven foam surface, and reaching outside base of support. Pt maintained balance with supervision and no near LOB. Hand-eye coordination and attention with ball toss and conversation. Pt able to coordinate task and maintain alternating attention. Pt returned to room at end of session and left in recliner with needs met.   Therapy Documentation Precautions:  Precautions Precautions: Fall Precaution Comments: abdominal incision precautions Restrictions Weight Bearing Restrictions: No Pain: Pain Assessment Pain Assessment: 0-10 Pain Score: 9  Pain Type: Surgical pain Pain Location: Abdomen Pain Orientation: Mid Pain Descriptors / Indicators: Aching Pain Frequency: Constant Pain Onset: On-going Patients Stated Pain Goal: 0 Pain Intervention(s): Repositioned;Ambulation/increased activity ADL: ADL ADL Comments: see functional navigator     See Function Navigator for Current Functional Status.   Therapy/Group: Individual Therapy  Valma Cava 05/10/2016, 12:24  PM

## 2016-05-10 NOTE — Discharge Summary (Signed)
Discharge summary job [id number]
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
## 2016-05-11 ENCOUNTER — Inpatient Hospital Stay (HOSPITAL_COMMUNITY): Payer: Self-pay | Admitting: Physical Therapy

## 2016-05-11 ENCOUNTER — Inpatient Hospital Stay (HOSPITAL_COMMUNITY): Payer: Self-pay | Admitting: Occupational Therapy

## 2016-05-11 ENCOUNTER — Inpatient Hospital Stay (HOSPITAL_COMMUNITY): Payer: Self-pay | Admitting: Speech Pathology

## 2016-05-11 DIAGNOSIS — F4321 Adjustment disorder with depressed mood: Secondary | ICD-10-CM

## 2016-05-11 DIAGNOSIS — F4311 Post-traumatic stress disorder, acute: Secondary | ICD-10-CM

## 2016-05-11 MED ORDER — METOPROLOL TARTRATE 50 MG PO TABS: 50.0000 mg | ORAL_TABLET | Freq: Two times a day (BID) | ORAL | 0 refills | Status: AC

## 2016-05-11 MED ORDER — OXYCODONE HCL 5 MG PO TABS: 5.0000 mg | ORAL_TABLET | ORAL | 0 refills | Status: DC | PRN

## 2016-05-11 MED ORDER — RIVAROXABAN 15 MG PO TABS: 15.0000 mg | ORAL_TABLET | Freq: Two times a day (BID) | ORAL | 0 refills | Status: DC

## 2016-05-11 MED ORDER — POLYETHYLENE GLYCOL 3350 17 G PO PACK: 17.0000 g | PACK | Freq: Every day | ORAL | 0 refills | Status: DC

## 2016-05-11 MED ORDER — RIVAROXABAN 20 MG PO TABS: ORAL_TABLET | ORAL | 1 refills | Status: DC

## 2016-05-11 MED ORDER — DOCUSATE SODIUM 100 MG PO CAPS: 100.0000 mg | ORAL_CAPSULE | Freq: Two times a day (BID) | ORAL | 0 refills | Status: AC

## 2016-05-11 MED ORDER — POLYETHYLENE GLYCOL 3350 17 G PO PACK
17.0000 g | PACK | Freq: Once | ORAL | Status: AC
  Administered 2016-05-11: 17 g via ORAL
  Filled 2016-05-11: qty 1

## 2016-05-11 NOTE — Discharge Instructions (Signed)
Inpatient Rehab Discharge Instructions  Bill [name] Discharge date and time: No discharge date for patient encounter.   Activities/Precautions/ Functional Status: Activity: activity as tolerated Diet: regular diet Wound Care: keep wound clean and dry Functional status:  ___ No restrictions     ___ Walk up steps independently ___ 24/7 supervision/assistance   ___ Walk up steps with assistance ___ Intermittent supervision/assistance  ___ Bathe/dress independently ___ Walk with walker     _x__ Bathe/dress with assistance ___ Walk Independently    ___ Shower independently ___ Walk with assistance    ___ Shower with assistance ___ No alcohol     ___ Return to work/school ________  Special Instructions: Wet-to-dry dressing to abdominal wound changed daily as needed   COMMUNITY REFERRALS UPON DISCHARGE:   None: TRAUMA MD'S HAVE CONNECTED PATIENT WITH MD TO FOLLOW IN [place]. NO EQUIPMENT NEEDED. NO FOLLOW UP THERAPIES RECOMMENDED-INDEPENDENT LEVEL  FOLLOW UP WITH [facility] FOR FOLLOW UP COUNSELING AND COPING  My questions have been answered and I understand these instructions. I will adhere to these goals and the provided educational materials after my discharge from the hospital.  Patient/Caregiver Signature _______________________________ Date __________  Clinician Signature _______________________________________ Date __________  Please bring this form and your medication list with you to all your follow-up doctor's appointments.     Information on my medicine - XARELTO (rivaroxaban)  This medication education was reviewed with me or my healthcare representative as part of my discharge preparation.    WHY WAS XARELTO PRESCRIBED FOR YOU? Xarelto was prescribed to treat blood clots that may have been found in the veins of your legs (deep vein thrombosis) or in your lungs (pulmonary embolism) and to reduce the risk of them occurring again.  What do you need to  know about Xarelto? The starting dose is one 15 mg tablet taken TWICE daily with food for the FIRST 21 DAYS then on 05/31/2016  the dose is changed to one 20 mg tablet taken ONCE A DAY with your evening meal.  DO NOT stop taking Xarelto without talking to the health care provider who prescribed the medication.  Refill your prescription for 20 mg tablets before you run out.  After discharge, you should have regular check-up appointments with your healthcare provider that is prescribing your Xarelto.  In the future your dose may need to be changed if your kidney function changes by a significant amount.  What do you do if you miss a dose? If you are taking Xarelto TWICE DAILY and you miss a dose, take it as soon as you remember. You may take two 15 mg tablets (total 30 mg) at the same time then resume your regularly scheduled 15 mg twice daily the next day.  If you are taking Xarelto ONCE DAILY and you miss a dose, take it as soon as you remember on the same day then continue your regularly scheduled once daily regimen the next day. Do not take two doses of Xarelto at the same time.   Important Safety Information Xarelto is a blood thinner medicine that can cause bleeding. You should call your healthcare provider right away if you experience any of the following: ? Bleeding from an injury or your nose that does not stop. ? Unusual colored urine (red or dark brown) or unusual colored stools (red or black). ? Unusual bruising for unknown reasons. ? A serious fall or if you hit your head (even if there is no bleeding).  Some medicines may interact with  Xarelto and might increase your risk of bleeding while on Xarelto. To help avoid this, consult your healthcare provider or pharmacist prior to using any new prescription or non-prescription medications, including herbals, vitamins, non-steroidal anti-inflammatory drugs (NSAIDs) and supplements.  This website has more information on Xarelto:  [email or web address].

## 2016-05-11 NOTE — Progress Notes (Signed)
Physical Therapy Discharge Summary  Patient Details  Name: Bill [name] MRN: [id number] Date of Birth: 02/11/1981  Today's Date: 05/11/2016 PT Individual Time: 1000-1055 PT Individual Time Calculation (min): 55 min   Patient has met 9 of 9 long term goals due to improved activity tolerance, improved balance, improved postural control, increased strength and decreased pain.  Patient to discharge at an ambulatory level Modified Independent.   Patient's care partner is independent to provide the necessary physical and cognitive assistance at discharge.  Reasons goals not met: NA  Recommendation:  No follow-up PT recommended at this time.  Equipment: No equipment provided  Reasons for discharge: treatment goals met and discharge from hospital  Patient/family agrees with progress made and goals achieved: Yes  Skilled Therapeutic Intervention Patient mod I without device, see function tab for details. 10 MWT = 0.77 m/s. Patient demonstrates low fall risk as noted by score of 55/56 on Berg Balance Scale, improved from 51/55 on eval. Instructed in 5TSS without UE support = 13 sec. Patient instructed in dynamic standing balance including tandem gait, backwards tandem gait, retro gait, ambulation with eyes closed, grapevine to L and R without LOB. Patient retrieved dirty laundry and put clothing in washing machine with mod I. Patient left sitting in recliner with all needs in reach and no further questions/concerns regarding discharge tomorrow.    PT Discharge Precautions/RestrictionsPrecautions Precaution Comments: abdominal incision precautions Restrictions Weight Bearing Restrictions: No Pain Pain Assessment Pain Assessment: 0-10 Pain Score: 7  Pain Type: Surgical pain Pain Location: Abdomen Pain Orientation: Mid Pain Descriptors / Indicators: Aching Pain Onset: On-going Pain Intervention(s): Rest (premedicated) Vision/Perception    No change from baseline Cognition Overall  Cognitive Status: Within Functional Limits for tasks assessed Sensation Sensation Light Touch: Appears Intact Hot/Cold: Appears Intact Proprioception: Appears Intact Coordination Gross Motor Movements are Fluid and Coordinated: Yes Fine Motor Movements are Fluid and Coordinated: Yes Motor  Motor Motor: Within Functional Limits Motor - Discharge Observations: limited by surgical pain in abdomen  Mobility Bed Mobility Bed Mobility: Rolling Left;Rolling Right;Supine to Sit;Sit to Supine Rolling Right: 6: Modified independent (Device/Increase time) Rolling Left: 6: Modified independent (Device/Increase time) Supine to Sit: 6: Modified independent (Device/Increase time) Sit to Supine: 6: Modified independent (Device/Increase time) Transfers Transfers: Yes Sit to Stand: 6: Modified independent (Device/Increase time) Stand to Sit: 6: Modified independent (Device/Increase time) Locomotion  Ambulation Ambulation: Yes Ambulation/Gait Assistance: 6: Modified independent (Device/Increase time) Ambulation Distance (Feet): 500 Feet Assistive device: None Gait Gait: Yes Gait Pattern: Decreased stride length;Step-through pattern;Trunk flexed Gait velocity: 10 MWT = 0.77 m/s Stairs / Additional Locomotion Stairs: Yes Stairs Assistance: 6: Modified independent (Device/Increase time) Stair Management Technique: One rail Left Number of Stairs: 22 Height of Stairs: 6 Ramp: 6: Modified independent (Device) Curb: 6: Modified independent (Device/increase time) Wheelchair Mobility Wheelchair Mobility: No  Trunk/Postural Assessment  Cervical Assessment Cervical Assessment: Within Functional Limits Thoracic Assessment Thoracic Assessment: Within Functional Limits Lumbar Assessment Lumbar Assessment: Within Functional Limits Postural Control Postural Control: Within Functional Limits  Balance Balance Balance Assessed: Yes Standardized Balance Assessment Standardized Balance Assessment:  Berg Balance Test Berg Balance Test Sit to Stand: Able to stand without using hands and stabilize independently Standing Unsupported: Able to stand safely 2 minutes Sitting with Back Unsupported but Feet Supported on Floor or Stool: Able to sit safely and securely 2 minutes Stand to Sit: Sits safely with minimal use of hands Transfers: Able to transfer safely, minor use of hands Standing Unsupported with Eyes Closed: Able  to stand 10 seconds safely Standing Ubsupported with Feet Together: Able to place feet together independently and stand 1 minute safely From Standing, Reach Forward with Outstretched Arm: Can reach confidently >25 cm (10") From Standing Position, Pick up Object from Floor: Able to pick up shoe safely and easily From Standing Position, Turn to Look Behind Over each Shoulder: Looks behind from both sides and weight shifts well Turn 360 Degrees: Able to turn 360 degrees safely in 4 seconds or less Standing Unsupported, Alternately Place Feet on Step/Stool: Able to stand independently and safely and complete 8 steps in 20 seconds Standing Unsupported, One Foot in Front: Able to place foot tandem independently and hold 30 seconds Standing on One Leg: Able to lift leg independently and hold 5-10 seconds Total Score: 55 Dynamic Standing Balance Dynamic Standing - Balance Support: No upper extremity supported;During functional activity Dynamic Standing - Level of Assistance: 6: Modified independent (Device/Increase time)  Five times Sit to Stand Test (FTSS) Method: Use a straight back chair with a solid seat that is 16-18" high. Ask participant to sit on the chair with arms folded across their chest.   Instructions: "Stand up and sit down as quickly as possible 5 times, keeping your arms folded across your chest."   Measurement: Stop timing when the participant stands the 5th time.  TIME: ___13___ (in seconds)  Times > 13.6 seconds is associated with increased disability and  morbidity (Guralnik, 2000) Times > 15 seconds is predictive of recurrent falls in healthy individuals aged 8 and older (Buatois, et al., 2008) Normal performance values in community dwelling individuals aged 69 and older (Bohannon, 2006): o 60-69 years: 11.4 seconds o 70-79 years: 12.6 seconds o 80-89 years: 14.8 seconds  MCID: ? 2.3 seconds for Vestibular Disorders (Meretta, 2006) Extremity Assessment  RUE Assessment RUE Assessment: Within Functional Limits LUE Assessment LUE Assessment: Within Functional Limits RLE Assessment RLE Assessment: Within Functional Limits LLE Assessment LLE Assessment: Within Functional Limits   See Function Navigator for Current Functional Status.  Carney Living A 05/11/2016, 8:00 AM
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
## 2016-05-12 DIAGNOSIS — S36113A Laceration of liver, unspecified degree, initial encounter: Secondary | ICD-10-CM

## 2016-05-12 DIAGNOSIS — T8130XS Disruption of wound, unspecified, sequela: Secondary | ICD-10-CM

## 2016-05-12 DIAGNOSIS — S36113S Laceration of liver, unspecified degree, sequela: Secondary | ICD-10-CM

## 2016-05-12 DIAGNOSIS — T8130XA Disruption of wound, unspecified, initial encounter: Secondary | ICD-10-CM

## 2016-05-12 DIAGNOSIS — T81321A Disruption or dehiscence of closure of internal operation (surgical) wound of abdominal wall muscle or fascia, initial encounter: Secondary | ICD-10-CM
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
## 2016-05-12 NOTE — Progress Notes (Signed)
[id number] 05/12/16 nursing Patient was discharged to home per wheelchair accompanied by family and NT. Per patient discharge instructions was done yesterday no further questions. Patient claims that he received his flu shot and pneumonia shot on this admission.

## 2016-05-14 ENCOUNTER — Emergency Department (HOSPITAL_COMMUNITY): Payer: Self-pay

## 2016-05-14 ENCOUNTER — Encounter (HOSPITAL_COMMUNITY): Payer: Self-pay | Admitting: Emergency Medicine

## 2016-05-14 ENCOUNTER — Inpatient Hospital Stay (HOSPITAL_COMMUNITY)
Admission: EM | Admit: 2016-05-14 | Discharge: 2016-05-18 | DRG: 871 | Disposition: A | Discharge status: Home / Self Care | Payer: Self-pay | Attending: Family Medicine | Admitting: Family Medicine

## 2016-05-14 DIAGNOSIS — Z87891 Personal history of nicotine dependence: Secondary | ICD-10-CM

## 2016-05-14 DIAGNOSIS — Z590 Homelessness: Secondary | ICD-10-CM

## 2016-05-14 DIAGNOSIS — D62 Acute posthemorrhagic anemia: Secondary | ICD-10-CM | POA: Diagnosis present

## 2016-05-14 DIAGNOSIS — Z91013 Allergy to seafood: Secondary | ICD-10-CM

## 2016-05-14 DIAGNOSIS — R079 Chest pain, unspecified: Secondary | ICD-10-CM

## 2016-05-14 DIAGNOSIS — R1110 Vomiting, unspecified: Secondary | ICD-10-CM

## 2016-05-14 DIAGNOSIS — I158 Other secondary hypertension: Secondary | ICD-10-CM | POA: Diagnosis present

## 2016-05-14 DIAGNOSIS — I517 Cardiomegaly: Secondary | ICD-10-CM | POA: Diagnosis present

## 2016-05-14 DIAGNOSIS — Z833 Family history of diabetes mellitus: Secondary | ICD-10-CM

## 2016-05-14 DIAGNOSIS — E860 Dehydration: Secondary | ICD-10-CM | POA: Diagnosis present

## 2016-05-14 DIAGNOSIS — I82432 Acute embolism and thrombosis of left popliteal vein: Secondary | ICD-10-CM | POA: Diagnosis present

## 2016-05-14 DIAGNOSIS — Z86718 Personal history of other venous thrombosis and embolism: Secondary | ICD-10-CM

## 2016-05-14 DIAGNOSIS — Z87820 Personal history of traumatic brain injury: Secondary | ICD-10-CM

## 2016-05-14 DIAGNOSIS — I159 Secondary hypertension, unspecified: Secondary | ICD-10-CM | POA: Diagnosis present

## 2016-05-14 DIAGNOSIS — Y95 Nosocomial condition: Secondary | ICD-10-CM | POA: Diagnosis present

## 2016-05-14 DIAGNOSIS — R059 Cough, unspecified: Secondary | ICD-10-CM

## 2016-05-14 DIAGNOSIS — I313 Pericardial effusion (noninflammatory): Secondary | ICD-10-CM | POA: Diagnosis present

## 2016-05-14 DIAGNOSIS — Z8249 Family history of ischemic heart disease and other diseases of the circulatory system: Secondary | ICD-10-CM

## 2016-05-14 DIAGNOSIS — R05 Cough: Secondary | ICD-10-CM

## 2016-05-14 DIAGNOSIS — Z7901 Long term (current) use of anticoagulants: Secondary | ICD-10-CM

## 2016-05-14 DIAGNOSIS — R0602 Shortness of breath: Secondary | ICD-10-CM

## 2016-05-14 DIAGNOSIS — A419 Sepsis, unspecified organism: Principal | ICD-10-CM | POA: Diagnosis present

## 2016-05-14 DIAGNOSIS — J189 Pneumonia, unspecified organism: Secondary | ICD-10-CM | POA: Diagnosis present

## 2016-05-14 HISTORY — DX: Accidental discharge from unspecified firearms or gun, initial encounter: W34.00XA

## 2016-05-14 LAB — CBC
HCT: 35.1 % — ABNORMAL LOW (ref 39.0–52.0)
HEMOGLOBIN: 11.2 g/dL — AB (ref 13.0–17.0)
MCH: 27.8 pg (ref 26.0–34.0)
MCHC: 31.9 g/dL (ref 30.0–36.0)
MCV: 87.1 fL (ref 78.0–100.0)
PLATELETS: 434 10*3/uL — AB (ref 150–400)
RBC: 4.03 MIL/uL — AB (ref 4.22–5.81)
RDW: 13.8 % (ref 11.5–15.5)
WBC: 19.9 10*3/uL — AB (ref 4.0–10.5)

## 2016-05-14 LAB — BASIC METABOLIC PANEL
ANION GAP: 9 (ref 5–15)
BUN: 6 mg/dL (ref 6–20)
CALCIUM: 9.3 mg/dL (ref 8.9–10.3)
CO2: 25 mmol/L (ref 22–32)
CREATININE: 0.72 mg/dL (ref 0.61–1.24)
Chloride: 102 mmol/L (ref 101–111)
Glucose, Bld: 113 mg/dL — ABNORMAL HIGH (ref 65–99)
Potassium: 3.9 mmol/L (ref 3.5–5.1)
SODIUM: 136 mmol/L (ref 135–145)

## 2016-05-14 LAB — I-STAT TROPONIN, ED: TROPONIN I, POC: 0 ng/mL (ref 0.00–0.08)

## 2016-05-14 LAB — I-STAT CG4 LACTIC ACID, ED: LACTIC ACID, VENOUS: 1.12 mmol/L (ref 0.5–1.9)

## 2016-05-14 MED ORDER — IPRATROPIUM BROMIDE 0.02 % IN SOLN
0.5000 mg | Freq: Once | RESPIRATORY_TRACT | Status: AC
  Administered 2016-05-15: 0.5 mg via RESPIRATORY_TRACT
  Filled 2016-05-14: qty 2.5

## 2016-05-14 MED ORDER — SODIUM CHLORIDE 0.9 % IV BOLUS (SEPSIS)
1000.0000 mL | Freq: Once | INTRAVENOUS | Status: AC
  Administered 2016-05-15: 1000 mL via INTRAVENOUS

## 2016-05-14 MED ORDER — IOPAMIDOL (ISOVUE-370) INJECTION 76%
INTRAVENOUS | Status: AC
  Filled 2016-05-14: qty 100

## 2016-05-14 MED ORDER — ONDANSETRON HCL 4 MG/2ML IJ SOLN
4.0000 mg | Freq: Once | INTRAMUSCULAR | Status: AC
  Administered 2016-05-14: 4 mg via INTRAVENOUS
  Filled 2016-05-14: qty 2

## 2016-05-14 MED ORDER — ACETAMINOPHEN 500 MG PO TABS
1000.0000 mg | ORAL_TABLET | Freq: Once | ORAL | Status: AC
  Administered 2016-05-14: 1000 mg via ORAL
  Filled 2016-05-14: qty 2

## 2016-05-14 MED ORDER — HYDROMORPHONE HCL 2 MG/ML IJ SOLN
1.0000 mg | Freq: Once | INTRAMUSCULAR | Status: AC
  Administered 2016-05-14: 1 mg via INTRAVENOUS
  Filled 2016-05-14: qty 1

## 2016-05-14 MED ORDER — ALBUTEROL SULFATE (2.5 MG/3ML) 0.083% IN NEBU
5.0000 mg | INHALATION_SOLUTION | Freq: Once | RESPIRATORY_TRACT | Status: AC
  Administered 2016-05-14: 5 mg via RESPIRATORY_TRACT
  Filled 2016-05-14: qty 6

## 2016-05-14 MED ORDER — SODIUM CHLORIDE 0.9 % IV BOLUS (SEPSIS)
1000.0000 mL | Freq: Once | INTRAVENOUS | Status: AC
  Administered 2016-05-14: 1000 mL via INTRAVENOUS

## 2016-05-14 NOTE — ED Notes (Signed)
Pt has recent surgical incision and drainage tube on abdomen. Pt requests dressing change.

## 2016-05-14 NOTE — ED Triage Notes (Signed)
Patient reports low chest pain with SOB and emesis onset this evening , denies diaphoresis .

## 2016-05-14 NOTE — ED Notes (Signed)
Family at bedside. 

## 2016-05-14 NOTE — ED Notes (Signed)
MD at bedside. 
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
## 2016-05-14 NOTE — ED Notes (Signed)
Patient transported to CT 

## 2016-05-15 ENCOUNTER — Emergency Department (HOSPITAL_COMMUNITY): Payer: Self-pay

## 2016-05-15 ENCOUNTER — Encounter (HOSPITAL_COMMUNITY): Payer: Self-pay | Admitting: Family Medicine

## 2016-05-15 DIAGNOSIS — J189 Pneumonia, unspecified organism: Secondary | ICD-10-CM

## 2016-05-15 DIAGNOSIS — D62 Acute posthemorrhagic anemia: Secondary | ICD-10-CM

## 2016-05-15 DIAGNOSIS — I158 Other secondary hypertension: Secondary | ICD-10-CM

## 2016-05-15 DIAGNOSIS — A419 Sepsis, unspecified organism: Secondary | ICD-10-CM | POA: Diagnosis present

## 2016-05-15 DIAGNOSIS — I82432 Acute embolism and thrombosis of left popliteal vein: Secondary | ICD-10-CM

## 2016-05-15 LAB — URINALYSIS, ROUTINE W REFLEX MICROSCOPIC
Bilirubin Urine: NEGATIVE
Glucose, UA: NEGATIVE mg/dL
Hgb urine dipstick: NEGATIVE
KETONES UR: NEGATIVE mg/dL
LEUKOCYTES UA: NEGATIVE
Nitrite: NEGATIVE
PROTEIN: NEGATIVE mg/dL
Specific Gravity, Urine: >1.046 — ABNORMAL HIGH (ref 1.005–1.030)
pH: 6 (ref 5.0–8.0)

## 2016-05-15 LAB — HEPATIC FUNCTION PANEL
ALBUMIN: 3.3 g/dL — AB (ref 3.5–5.0)
ALT: 26 U/L (ref 17–63)
AST: 24 U/L (ref 15–41)
Alkaline Phosphatase: 131 U/L — ABNORMAL HIGH (ref 38–126)
BILIRUBIN DIRECT: 0.3 mg/dL (ref 0.1–0.5)
BILIRUBIN INDIRECT: 0.8 mg/dL (ref 0.3–0.9)
BILIRUBIN TOTAL: 1.1 mg/dL (ref 0.3–1.2)
Total Protein: 8.6 g/dL — ABNORMAL HIGH (ref 6.5–8.1)

## 2016-05-15 LAB — PROCALCITONIN: Procalcitonin: <0.1 ng/mL

## 2016-05-15 LAB — EXPECTORATED SPUTUM ASSESSMENT W REFEX TO RESP CULTURE

## 2016-05-15 LAB — LIPASE, BLOOD: LIPASE: 33 U/L (ref 11–51)

## 2016-05-15 LAB — EXPECTORATED SPUTUM ASSESSMENT W GRAM STAIN, RFLX TO RESP C

## 2016-05-15 LAB — I-STAT CG4 LACTIC ACID, ED: LACTIC ACID, VENOUS: 0.52 mmol/L (ref 0.5–1.9)

## 2016-05-15 MED ORDER — KETOROLAC TROMETHAMINE 30 MG/ML IJ SOLN
30.0000 mg | Freq: Four times a day (QID) | INTRAMUSCULAR | Status: DC | PRN
  Administered 2016-05-15 – 2016-05-18 (×4): 30 mg via INTRAVENOUS
  Filled 2016-05-15 (×5): qty 1

## 2016-05-15 MED ORDER — HYDROMORPHONE HCL 1 MG/ML IJ SOLN
1.0000 mg | INTRAMUSCULAR | Status: DC | PRN
  Administered 2016-05-15 – 2016-05-18 (×14): 1 mg via INTRAVENOUS
  Filled 2016-05-15 (×13): qty 1

## 2016-05-15 MED ORDER — SODIUM CHLORIDE 0.9 % IV SOLN
INTRAVENOUS | Status: DC
  Administered 2016-05-15: 1000 mL via INTRAVENOUS
  Administered 2016-05-16 – 2016-05-17 (×2): via INTRAVENOUS
  Administered 2016-05-17: 950 mL via INTRAVENOUS

## 2016-05-15 MED ORDER — HYDROMORPHONE HCL 2 MG/ML IJ SOLN
1.0000 mg | Freq: Once | INTRAMUSCULAR | Status: AC
  Administered 2016-05-15: 1 mg via INTRAVENOUS
  Filled 2016-05-15: qty 1

## 2016-05-15 MED ORDER — OXYCODONE HCL 5 MG PO TABS
5.0000 mg | ORAL_TABLET | ORAL | Status: DC | PRN
Start: 2016-05-15 — End: 2016-05-18
  Administered 2016-05-15 – 2016-05-18 (×5): 5 mg via ORAL
  Filled 2016-05-15 (×8): qty 1

## 2016-05-15 MED ORDER — IOPAMIDOL (ISOVUE-370) INJECTION 76%
100.0000 mL | Freq: Once | INTRAVENOUS | Status: AC | PRN
  Administered 2016-05-15: 100 mL via INTRAVENOUS

## 2016-05-15 MED ORDER — DEXTROSE 5 % IV SOLN
1.0000 g | Freq: Three times a day (TID) | INTRAVENOUS | Status: DC
  Administered 2016-05-15 – 2016-05-16 (×4): 1 g via INTRAVENOUS
  Filled 2016-05-15 (×4): qty 1

## 2016-05-15 MED ORDER — ACETAMINOPHEN 650 MG RE SUPP: 650.0000 mg | Freq: Four times a day (QID) | RECTAL | Status: DC | PRN

## 2016-05-15 MED ORDER — SODIUM CHLORIDE 0.9 % IV BOLUS (SEPSIS)
1000.0000 mL | Freq: Once | INTRAVENOUS | Status: AC
  Administered 2016-05-15: 1000 mL via INTRAVENOUS

## 2016-05-15 MED ORDER — HYDROMORPHONE HCL 1 MG/ML IJ SOLN
1.0000 mg | INTRAMUSCULAR | Status: AC | PRN
  Administered 2016-05-15 (×2): 1 mg via INTRAVENOUS
  Filled 2016-05-15 (×4): qty 1

## 2016-05-15 MED ORDER — VANCOMYCIN HCL IN DEXTROSE 1-5 GM/200ML-% IV SOLN
1000.0000 mg | INTRAVENOUS | Status: AC
  Administered 2016-05-15: 1000 mg via INTRAVENOUS
  Filled 2016-05-15: qty 200

## 2016-05-15 MED ORDER — RIVAROXABAN 20 MG PO TABS: 20.0000 mg | ORAL_TABLET | Freq: Every day | ORAL | Status: DC

## 2016-05-15 MED ORDER — DOXYCYCLINE HYCLATE 100 MG PO TABS
100.0000 mg | ORAL_TABLET | Freq: Once | ORAL | Status: AC
  Administered 2016-05-15: 100 mg via ORAL
  Filled 2016-05-15: qty 1

## 2016-05-15 MED ORDER — VANCOMYCIN HCL IN DEXTROSE 1-5 GM/200ML-% IV SOLN
1000.0000 mg | Freq: Three times a day (TID) | INTRAVENOUS | Status: DC
  Administered 2016-05-15 – 2016-05-16 (×3): 1000 mg via INTRAVENOUS
  Filled 2016-05-15 (×5): qty 200

## 2016-05-15 MED ORDER — ONDANSETRON HCL 4 MG/2ML IJ SOLN
4.0000 mg | Freq: Three times a day (TID) | INTRAMUSCULAR | Status: AC | PRN
  Administered 2016-05-15: 4 mg via INTRAVENOUS
  Filled 2016-05-15 (×2): qty 2

## 2016-05-15 MED ORDER — KETOROLAC TROMETHAMINE 30 MG/ML IJ SOLN
30.0000 mg | Freq: Once | INTRAMUSCULAR | Status: AC
  Administered 2016-05-15: 30 mg via INTRAVENOUS
  Filled 2016-05-15: qty 1

## 2016-05-15 MED ORDER — DEXTROSE 5 % IV SOLN
1.0000 g | Freq: Once | INTRAVENOUS | Status: DC
  Filled 2016-05-15 (×3): qty 1

## 2016-05-15 MED ORDER — POLYETHYLENE GLYCOL 3350 17 GM/SCOOP PO POWD: 17.0000 g | Freq: Every day | ORAL | 0 refills | Status: DC

## 2016-05-15 MED ORDER — ACETAMINOPHEN 325 MG PO TABS
650.0000 mg | ORAL_TABLET | Freq: Four times a day (QID) | ORAL | Status: DC | PRN
  Administered 2016-05-15 – 2016-05-18 (×6): 650 mg via ORAL
  Filled 2016-05-15 (×6): qty 2

## 2016-05-15 MED ORDER — OXYCODONE HCL 5 MG PO TABS
15.0000 mg | ORAL_TABLET | Freq: Once | ORAL | Status: AC
  Administered 2016-05-15: 15 mg via ORAL
  Filled 2016-05-15: qty 3

## 2016-05-15 MED ORDER — HYDROMORPHONE HCL 2 MG/ML IJ SOLN: 1.0000 mg | INTRAMUSCULAR | Status: DC | PRN

## 2016-05-15 MED ORDER — RIVAROXABAN 15 MG PO TABS
15.0000 mg | ORAL_TABLET | Freq: Two times a day (BID) | ORAL | Status: DC
  Administered 2016-05-15 – 2016-05-18 (×7): 15 mg via ORAL
  Filled 2016-05-15 (×7): qty 1

## 2016-05-15 MED ORDER — DOCUSATE SODIUM 100 MG PO CAPS
100.0000 mg | ORAL_CAPSULE | Freq: Two times a day (BID) | ORAL | Status: DC
  Administered 2016-05-15 – 2016-05-18 (×5): 100 mg via ORAL
  Filled 2016-05-15 (×6): qty 1

## 2016-05-15 MED ORDER — ONDANSETRON 4 MG PO TBDP: 4.0000 mg | ORAL_TABLET | Freq: Three times a day (TID) | ORAL | 0 refills | Status: AC | PRN

## 2016-05-15 NOTE — Progress Notes (Signed)
Pharmacy Antibiotic Note  Bill [name] is a 35 y.o. male admitted on 05/14/2016 with pneumonia.  Pharmacy has been consulted for vancomycin dosing.  Plan: Vancomycin 1000mg  IV every 8 hours.  Goal trough 15-20 mcg/mL.  Height: 5\' 11"  (180.3 cm) Weight: 220 lb (99.8 kg) IBW/kg (Calculated) : 75.3  Temp (24hrs), Avg:99.4 F (37.4 C), Min:99 F (37.2 C), Max:99.7 F (37.6 C)   Recent Labs Lab 05/08/16 1618 05/09/16 1002 05/14/16 1949 05/14/16 2327 05/15/16 0130  WBC 15.5* 13.3* 19.9*  --   --   CREATININE 0.69 0.76 0.72  --   --   LATICACIDVEN  --   --   --  1.12 0.52    Estimated Creatinine Clearance: 155.1 mL/min (by C-G formula based on SCr of 0.72 mg/dL).    Allergies  Allergen Reactions  . Fish Allergy Hives and Swelling    PT reports an ALL to fresh water fish. Pt reports he swells up and gets hives.  . Fish Allergy Hives and Swelling    No "fresh water" fish!!  . Other Anaphylaxis    Cats swelling  . Other Anaphylaxis    Pet dander     Thank you for allowing pharmacy to be a part of this patient's care.  Bill [name], PharmD, BCPS  05/15/2016 6:05 AM

## 2016-05-15 NOTE — ED Notes (Signed)
Patient returned from CT

## 2016-05-15 NOTE — ED Notes (Signed)
Attempted to give report to receiving floor

## 2016-05-15 NOTE — ED Notes (Signed)
Pt ambulated to bathroom with supervision  

## 2016-05-15 NOTE — Progress Notes (Signed)
Pharmacy - Xarelto Dosing  Patient was to take Xarelto 15 mg PO bidwm through 05/30/16 then 20 mg daily with supper starting 05/31/16.  Adjusted schedule inpatient.  Loura BackJennifer Sherrill, PharmD, BCPS Clinical Pharmacist Phone for today [phone] Main pharmacy - [phone] 05/15/2016 10:42 AM
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
## 2016-05-15 NOTE — Progress Notes (Signed)
Patient ID: Bill [name], male   DOB: 09-11-1980, 35 y.o.   MRN: [id number] Admitted to the hospitalist service for PNA. JP has been putting out 25cc/d per family. No abdominal pain. CT has no residual collection. JP removed. Continue WTD daily to midline - it is very clean.  [name] Teyonna Plaisted, MD, MPH, FACS Trauma: [phone] General Surgery: [phone]
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
## 2016-05-16 DIAGNOSIS — A419 Sepsis, unspecified organism: Principal | ICD-10-CM

## 2016-05-16 LAB — COMPREHENSIVE METABOLIC PANEL
ALT: 16 U/L — AB (ref 17–63)
ANION GAP: 8 (ref 5–15)
AST: 17 U/L (ref 15–41)
Albumin: 2.4 g/dL — ABNORMAL LOW (ref 3.5–5.0)
Alkaline Phosphatase: 107 U/L (ref 38–126)
BUN: <5 mg/dL — ABNORMAL LOW (ref 6–20)
CHLORIDE: 101 mmol/L (ref 101–111)
CO2: 25 mmol/L (ref 22–32)
CREATININE: 0.75 mg/dL (ref 0.61–1.24)
Calcium: 8.3 mg/dL — ABNORMAL LOW (ref 8.9–10.3)
Glucose, Bld: 109 mg/dL — ABNORMAL HIGH (ref 65–99)
POTASSIUM: 3.7 mmol/L (ref 3.5–5.1)
Sodium: 134 mmol/L — ABNORMAL LOW (ref 135–145)
Total Bilirubin: 0.9 mg/dL (ref 0.3–1.2)
Total Protein: 7 g/dL (ref 6.5–8.1)

## 2016-05-16 LAB — CBC
HCT: 28.9 % — ABNORMAL LOW (ref 39.0–52.0)
HEMOGLOBIN: 9.3 g/dL — AB (ref 13.0–17.0)
MCH: 27.9 pg (ref 26.0–34.0)
MCHC: 32.2 g/dL (ref 30.0–36.0)
MCV: 86.8 fL (ref 78.0–100.0)
PLATELETS: 269 10*3/uL (ref 150–400)
RBC: 3.33 MIL/uL — AB (ref 4.22–5.81)
RDW: 13.8 % (ref 11.5–15.5)
WBC: 15.5 10*3/uL — AB (ref 4.0–10.5)

## 2016-05-16 MED ORDER — PIPERACILLIN-TAZOBACTAM 3.375 G IVPB
3.3750 g | Freq: Three times a day (TID) | INTRAVENOUS | Status: DC
  Administered 2016-05-16 – 2016-05-18 (×6): 3.375 g via INTRAVENOUS
  Filled 2016-05-16 (×7): qty 50

## 2016-05-16 NOTE — Progress Notes (Signed)
Pharmacy Antibiotic Note  Bill [name] is a 35 y.o. male admitted on 05/14/2016 with pneumonia.  Pharmacy has been consulted for Zosyn dosing for aspiration PNA, already on vancomycin. He remains febrile this morning despite antibiotics. Sputum collected with multiple organisms on gram stain thus far. Renal function is normal.  Plan: Zosyn 3.375 g IV q8h to be infused over 4 hours Follow-up culture data and narrow antibiotics as able Vancomycin trough tomorrow morning Monitor renal function  Height: 5\' 11"  (180.3 cm) Weight: 216 lb 11.4 oz (98.3 kg) IBW/kg (Calculated) : 75.3  Temp (24hrs), Avg:100.4 F (38 C), Min:99 F (37.2 C), Max:102.1 F (38.9 C)   Recent Labs Lab 05/14/16 1949 05/14/16 2327 05/15/16 0130 05/16/16 0600  WBC 19.9*  --   --  15.5*  CREATININE 0.72  --   --  0.75  LATICACIDVEN  --  1.12 0.52  --     Estimated Creatinine Clearance: 154 mL/min (by C-G formula based on SCr of 0.75 mg/dL).    Allergies  Allergen Reactions  . Fish Allergy Hives and Swelling    PT reports an ALL to fresh water fish. Pt reports he swells up and gets hives.  . Fish Allergy Hives and Swelling    No "fresh water" fish!!  . Other Anaphylaxis    Cats swelling  . Other Anaphylaxis    Pet dander     Thank you for allowing pharmacy to be a part of this patient's care.  Loura BackJennifer Kempton, PharmD, BCPS Clinical Pharmacist Phone for today [phone] Main pharmacy - [phone] 05/16/2016 11:49 AM

## 2016-05-16 NOTE — Progress Notes (Signed)
Patient ID: Bill [name], male   DOB: January 30, 1981, 35 y.o.   MRN: [id number] Still having some chest pain. Abd soft, NT Doing well with drain out. Appreciate medical management of PNA. Please call us PRN.  [name] Vaughn Frieze, MD, MPH, FACS Trauma: [phone] General Surgery: [phone]
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
## 2016-05-17 LAB — COMPREHENSIVE METABOLIC PANEL
ALT: 16 U/L — ABNORMAL LOW (ref 17–63)
AST: 14 U/L — AB (ref 15–41)
Albumin: 2.5 g/dL — ABNORMAL LOW (ref 3.5–5.0)
Alkaline Phosphatase: 109 U/L (ref 38–126)
Anion gap: 6 (ref 5–15)
BILIRUBIN TOTAL: 1.1 mg/dL (ref 0.3–1.2)
CO2: 28 mmol/L (ref 22–32)
CREATININE: 0.61 mg/dL (ref 0.61–1.24)
Calcium: 8.5 mg/dL — ABNORMAL LOW (ref 8.9–10.3)
Chloride: 102 mmol/L (ref 101–111)
Glucose, Bld: 104 mg/dL — ABNORMAL HIGH (ref 65–99)
POTASSIUM: 3.4 mmol/L — AB (ref 3.5–5.1)
Sodium: 136 mmol/L (ref 135–145)
TOTAL PROTEIN: 7.3 g/dL (ref 6.5–8.1)

## 2016-05-17 LAB — CBC
HEMATOCRIT: 29.4 % — AB (ref 39.0–52.0)
Hemoglobin: 9.5 g/dL — ABNORMAL LOW (ref 13.0–17.0)
MCH: 27.8 pg (ref 26.0–34.0)
MCHC: 32.3 g/dL (ref 30.0–36.0)
MCV: 86 fL (ref 78.0–100.0)
PLATELETS: 284 10*3/uL (ref 150–400)
RBC: 3.42 MIL/uL — ABNORMAL LOW (ref 4.22–5.81)
RDW: 13.8 % (ref 11.5–15.5)
WBC: 13.4 10*3/uL — AB (ref 4.0–10.5)

## 2016-05-17 LAB — CULTURE, RESPIRATORY: CULTURE: NORMAL

## 2016-05-17 LAB — CULTURE, RESPIRATORY W GRAM STAIN

## 2016-05-17 LAB — PROCALCITONIN: Procalcitonin: <0.1 ng/mL

## 2016-05-17 MED ORDER — METOPROLOL TARTRATE 25 MG PO TABS
25.0000 mg | ORAL_TABLET | Freq: Two times a day (BID) | ORAL | Status: DC
  Administered 2016-05-17 – 2016-05-18 (×3): 25 mg via ORAL
  Filled 2016-05-17 (×3): qty 1

## 2016-05-17 NOTE — Discharge Instructions (Signed)
Follow-up Information     [facility]. Schedule an appointment as soon as possible for a visit today.   Contact information: [address]     [address] [place] [phone]          [facility]. Schedule an appointment as soon as possible for a visit today.   Contact information: [address] [place] [phone]   Information on my medicine - XARELTO (rivaroxaban)  This medication education was reviewed with me or my healthcare representative as part of my discharge preparation.  The pharmacist that spoke with me during my hospital stay was:  [facility], Maryanna ShapeJennifer Danielle, [facility]  WHY WAS Carlena HurlXARELTO PRESCRIBED FOR YOU? Xarelto was prescribed to treat blood clots that may have been found in the veins of your legs (deep vein thrombosis) or in your lungs (pulmonary embolism) and to reduce the risk of them occurring again.  What do you need to know about Xarelto? The starting dose is one 15 mg tablet taken TWICE daily with food for the FIRST 21 DAYS then on 05/31/2016  the dose is changed to one 20 mg tablet taken ONCE A DAY with your evening meal.  DO NOT stop taking Xarelto without talking to the health care provider who prescribed the medication.  Refill your prescription for 20 mg tablets before you run out.  After discharge, you should have regular check-up appointments with your healthcare provider that is prescribing your Xarelto.  In the future your dose may need to be changed if your kidney function changes by a significant amount.  What do you do if you miss a dose? If you are taking Xarelto TWICE DAILY and you miss a dose, take it as soon as you remember. You may take two 15 mg tablets (total 30 mg) at the same time then resume your regularly scheduled 15 mg twice daily the next day.  If you are taking Xarelto ONCE DAILY and you miss a dose, take it as soon as you  remember on the same day then continue your regularly scheduled once daily regimen the next day. Do not take two doses of Xarelto at the same time.   Important Safety Information Xarelto is a blood thinner medicine that can cause bleeding. You should call your healthcare provider right away if you experience any of the following: ? Bleeding from an injury or your nose that does not stop. ? Unusual colored urine (red or dark brown) or unusual colored stools (red or black). ? Unusual bruising for unknown reasons. ? A serious fall or if you hit your head (even if there is no bleeding).  Some medicines may interact with Xarelto and might increase your risk of bleeding while on Xarelto. To help avoid this, consult your healthcare provider or pharmacist prior to using any new prescription or non-prescription medications, including herbals, vitamins, non-steroidal anti-inflammatory drugs (NSAIDs) and supplements.  This website has more information on Xarelto: [email or web address].

## 2016-05-17 NOTE — Progress Notes (Signed)
Pt. Reported he took the packing out of the dressing prior to student changing dressing.  Pt. and girlfriend were cleaning and touching incision wound prior to student arrival.  Student RN educated pt and girlfriend on risk for infection.  Student RN encouraged to call the RN and informed patient not to touch wound incision due to risk for infection.  Student RN explained the importance of washing hands and wearing gloves prior to touch wound incision.
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
## 2016-05-18 MED ORDER — RIVAROXABAN (XARELTO) VTE STARTER PACK (15 & 20 MG): ORAL_TABLET | ORAL | 0 refills | Status: AC

## 2016-05-18 MED ORDER — AMOXICILLIN-POT CLAVULANATE 875-125 MG PO TABS: 1.0000 | ORAL_TABLET | Freq: Two times a day (BID) | ORAL | 0 refills | Status: AC

## 2016-05-18 MED ORDER — ACETAMINOPHEN 325 MG PO TABS: 650.0000 mg | ORAL_TABLET | Freq: Four times a day (QID) | ORAL | Status: AC | PRN

## 2016-05-18 MED ORDER — OXYCODONE HCL 5 MG PO TABS: 5.0000 mg | ORAL_TABLET | ORAL | 0 refills | Status: AC | PRN

## 2016-05-18 MED ORDER — AMOXICILLIN-POT CLAVULANATE 875-125 MG PO TABS
1.0000 | ORAL_TABLET | Freq: Two times a day (BID) | ORAL | Status: DC
  Administered 2016-05-18: 1 via ORAL
  Filled 2016-05-18: qty 1

## 2016-05-18 NOTE — Final Progress Note (Signed)
Patient discharge teaching given, including activity, diet, follow-up appoints, and medications. Patient verbalized understanding of all discharge instructions. IV access was d/c'd. Vitals are stable. Skin is intact except as charted in most recent assessments. Pt to be escorted out by family (did not want to wait for NT), to be driven home by family.
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
## 2016-05-20 LAB — CULTURE, BLOOD (ROUTINE X 2)
Culture: NO GROWTH
Culture: NO GROWTH

## 2016-05-21 ENCOUNTER — Telehealth (HOSPITAL_COMMUNITY): Payer: Self-pay

## 2016-05-21 NOTE — Telephone Encounter (Signed)
LM informing pt of appt on 11/8; he will call back to confirm.

## 2016-05-23 ENCOUNTER — Encounter: Payer: Self-pay | Admitting: Diagnostic Neuroimaging

## 2016-05-23 ENCOUNTER — Ambulatory Visit (INDEPENDENT_AMBULATORY_CARE_PROVIDER_SITE_OTHER): Payer: Self-pay | Admitting: Diagnostic Neuroimaging

## 2016-05-23 VITALS — BP 131/86 | HR 67 | Ht 71.0 in | Wt 213.0 lb

## 2016-05-23 DIAGNOSIS — G931 Anoxic brain damage, not elsewhere classified: Secondary | ICD-10-CM
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
## 2016-05-27 ENCOUNTER — Encounter (HOSPITAL_COMMUNITY): Payer: Self-pay | Admitting: *Deleted

## 2016-05-27 ENCOUNTER — Emergency Department (HOSPITAL_COMMUNITY)
Admission: EM | Admit: 2016-05-27 | Discharge: 2016-05-28 | Disposition: A | Discharge status: Home / Self Care | Payer: Self-pay | Attending: Emergency Medicine | Admitting: Emergency Medicine

## 2016-05-27 ENCOUNTER — Emergency Department (HOSPITAL_COMMUNITY): Payer: Self-pay

## 2016-05-27 DIAGNOSIS — Z7901 Long term (current) use of anticoagulants: Secondary | ICD-10-CM | POA: Insufficient documentation

## 2016-05-27 DIAGNOSIS — R0789 Other chest pain: Secondary | ICD-10-CM | POA: Insufficient documentation

## 2016-05-27 DIAGNOSIS — Z87891 Personal history of nicotine dependence: Secondary | ICD-10-CM | POA: Insufficient documentation

## 2016-05-27 DIAGNOSIS — I10 Essential (primary) hypertension: Secondary | ICD-10-CM | POA: Insufficient documentation

## 2016-05-27 LAB — BASIC METABOLIC PANEL
ANION GAP: 9 (ref 5–15)
BUN: 8 mg/dL (ref 6–20)
CHLORIDE: 101 mmol/L (ref 101–111)
CO2: 28 mmol/L (ref 22–32)
Calcium: 9.8 mg/dL (ref 8.9–10.3)
Creatinine, Ser: 0.79 mg/dL (ref 0.61–1.24)
GFR calc non Af Amer: >60 mL/min (ref >60)
GLUCOSE: 104 mg/dL — AB (ref 65–99)
POTASSIUM: 3.6 mmol/L (ref 3.5–5.1)
Sodium: 138 mmol/L (ref 135–145)

## 2016-05-27 LAB — CBC
HEMATOCRIT: 36.8 % — AB (ref 39.0–52.0)
HEMOGLOBIN: 11.9 g/dL — AB (ref 13.0–17.0)
MCH: 28 pg (ref 26.0–34.0)
MCHC: 32.3 g/dL (ref 30.0–36.0)
MCV: 86.6 fL (ref 78.0–100.0)
Platelets: 619 10*3/uL — ABNORMAL HIGH (ref 150–400)
RBC: 4.25 MIL/uL (ref 4.22–5.81)
RDW: 14.1 % (ref 11.5–15.5)
WBC: 12.4 10*3/uL — ABNORMAL HIGH (ref 4.0–10.5)

## 2016-05-27 LAB — I-STAT TROPONIN, ED: TROPONIN I, POC: 0 ng/mL (ref 0.00–0.08)

## 2016-05-27 MED ORDER — KETOROLAC TROMETHAMINE 30 MG/ML IJ SOLN
30.0000 mg | Freq: Once | INTRAMUSCULAR | Status: AC
  Administered 2016-05-28: 30 mg via INTRAVENOUS
  Filled 2016-05-27: qty 1

## 2016-05-27 MED ORDER — SODIUM CHLORIDE 0.9 % IV BOLUS (SEPSIS)
1000.0000 mL | Freq: Once | INTRAVENOUS | Status: AC
  Administered 2016-05-28: 1000 mL via INTRAVENOUS
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
## 2016-05-28 ENCOUNTER — Encounter (HOSPITAL_COMMUNITY): Payer: Self-pay | Admitting: Radiology

## 2016-05-28 ENCOUNTER — Emergency Department (HOSPITAL_COMMUNITY): Payer: Self-pay

## 2016-05-28 MED ORDER — IOPAMIDOL (ISOVUE-370) INJECTION 76%
INTRAVENOUS | Status: AC
  Administered 2016-05-28: 80 mL
  Filled 2016-05-28: qty 100
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
## 2016-05-28 NOTE — ED Notes (Signed)
Dressing to abdomen noted to be soiled.  Changed per patient request using directions he was given on discharge.  Wet to dry with ABD pad and tape.  Tolerated well.  Wound bed beefy red and well approximated.

## 2016-08-02 ENCOUNTER — Telehealth: Payer: Self-pay | Admitting: *Deleted

## 2016-08-02 NOTE — Telephone Encounter (Signed)
Patient verified DOB Patient is aware of appointment being made for 08/08/16 at 4:15pm. Patient states he completed the blood thinner and does have some swelling. Patient admits after elevating the legs the swelling decreases. Patient expressed his understanding and had no further questions at this time.

## 2016-08-08 ENCOUNTER — Ambulatory Visit: Payer: Self-pay | Admitting: Internal Medicine

## 2016-12-26 ENCOUNTER — Emergency Department (HOSPITAL_COMMUNITY): Payer: Self-pay

## 2016-12-26 ENCOUNTER — Emergency Department (HOSPITAL_COMMUNITY)
Admission: EM | Admit: 2016-12-26 | Discharge: 2016-12-26 | Disposition: A | Discharge status: Home / Self Care | Payer: Self-pay | Attending: Emergency Medicine | Admitting: Emergency Medicine

## 2016-12-26 ENCOUNTER — Encounter (HOSPITAL_COMMUNITY): Payer: Self-pay | Admitting: Nurse Practitioner

## 2016-12-26 DIAGNOSIS — I10 Essential (primary) hypertension: Secondary | ICD-10-CM | POA: Insufficient documentation

## 2016-12-26 DIAGNOSIS — R079 Chest pain, unspecified: Secondary | ICD-10-CM

## 2016-12-26 DIAGNOSIS — F17210 Nicotine dependence, cigarettes, uncomplicated: Secondary | ICD-10-CM | POA: Insufficient documentation

## 2016-12-26 DIAGNOSIS — Z79899 Other long term (current) drug therapy: Secondary | ICD-10-CM | POA: Insufficient documentation

## 2016-12-26 DIAGNOSIS — R0789 Other chest pain: Secondary | ICD-10-CM | POA: Insufficient documentation

## 2016-12-26 DIAGNOSIS — Z7901 Long term (current) use of anticoagulants: Secondary | ICD-10-CM | POA: Insufficient documentation

## 2016-12-26 LAB — I-STAT TROPONIN, ED: Troponin i, poc: 0 ng/mL (ref 0.00–0.08)

## 2016-12-26 LAB — BASIC METABOLIC PANEL
ANION GAP: 10 (ref 5–15)
BUN: 8 mg/dL (ref 6–20)
CHLORIDE: 102 mmol/L (ref 101–111)
CO2: 26 mmol/L (ref 22–32)
CREATININE: 1.01 mg/dL (ref 0.61–1.24)
Calcium: 9.2 mg/dL (ref 8.9–10.3)
GFR calc non Af Amer: >60 mL/min (ref >60)
Glucose, Bld: 98 mg/dL (ref 65–99)
POTASSIUM: 4.3 mmol/L (ref 3.5–5.1)
Sodium: 138 mmol/L (ref 135–145)

## 2016-12-26 LAB — CBC
HEMATOCRIT: 45.3 % (ref 39.0–52.0)
HEMOGLOBIN: 15.7 g/dL (ref 13.0–17.0)
MCH: 29.8 pg (ref 26.0–34.0)
MCHC: 34.7 g/dL (ref 30.0–36.0)
MCV: 86 fL (ref 78.0–100.0)
Platelets: 359 10*3/uL (ref 150–400)
RBC: 5.27 MIL/uL (ref 4.22–5.81)
RDW: 12.5 % (ref 11.5–15.5)
WBC: 14.1 10*3/uL — ABNORMAL HIGH (ref 4.0–10.5)

## 2016-12-26 LAB — D-DIMER, QUANTITATIVE (NOT AT ARMC): D DIMER QUANT: 0.51 ug{FEU}/mL — AB (ref 0.00–0.50)

## 2016-12-26 MED ORDER — IOPAMIDOL (ISOVUE-370) INJECTION 76%
INTRAVENOUS | Status: AC
  Administered 2016-12-26: 100 mL
  Filled 2016-12-26: qty 100

## 2016-12-26 MED ORDER — KETOROLAC TROMETHAMINE 30 MG/ML IJ SOLN
30.0000 mg | Freq: Once | INTRAMUSCULAR | Status: AC
  Administered 2016-12-26: 30 mg via INTRAVENOUS
  Filled 2016-12-26: qty 1

## 2016-12-26 NOTE — ED Notes (Signed)
ED Provider at bedside. 

## 2016-12-26 NOTE — Discharge Instructions (Signed)
Your CT and heart work-up looks reassuring. Your pain is due to muscle strain. Take ibuprofen and tylenol for pain. Give yourself 1-2 weeks to feel better Return for worsening symptoms, including fever, passing out, difficulty breathing or any other symptoms concerning to you.

## 2016-12-26 NOTE — ED Triage Notes (Signed)
Pt presents with c/o CP. The pain has been intermittent since it began 2 days ago, but since last night has remained constant. The pain is a sharp pain in his left axilla radiating into left chest. He reports lightheadedness, weakness, sweats, productive cough, shortness of breath. He denies nausea or vomiting, recent heavy lifting or exertion.

## 2016-12-26 NOTE — ED Notes (Signed)
Family at bedside. 

## 2016-12-26 NOTE — ED Notes (Signed)
Requesting something to drink.  Informed patient that he needed to wait until all tests are back before eating or drinking.  States I already ate pizza.
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
## 2016-12-26 NOTE — ED Notes (Signed)
Continues to ask for something to drink.  Informed he must wait for ct results.
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
